# Patient Record
Sex: Male | Born: 1995 | Race: White | Hispanic: No | Marital: Single | Smoking: Never smoker
Health system: Southern US, Community
[De-identification: ages and names within clinical notes are randomized; demographics above are authoritative.]

## PROBLEM LIST (undated history)

## (undated) DIAGNOSIS — G4700 Insomnia, unspecified: Secondary | ICD-10-CM

## (undated) HISTORY — PX: WISDOM TOOTH EXTRACTION: SHX21

---

## 2014-04-11 ENCOUNTER — Emergency Department (HOSPITAL_COMMUNITY)
Admission: EM | Admit: 2014-04-11 | Discharge: 2014-04-11 | Disposition: A | Discharge status: Home / Self Care | Payer: Medicaid Other | Attending: Emergency Medicine | Admitting: Emergency Medicine

## 2014-04-11 ENCOUNTER — Encounter (HOSPITAL_COMMUNITY): Payer: Self-pay | Admitting: Emergency Medicine

## 2014-04-11 DIAGNOSIS — X30XXXA Exposure to excessive natural heat, initial encounter: Secondary | ICD-10-CM | POA: Diagnosis not present

## 2014-04-11 DIAGNOSIS — R110 Nausea: Secondary | ICD-10-CM | POA: Insufficient documentation

## 2014-04-11 DIAGNOSIS — T6701XA Heatstroke and sunstroke, initial encounter: Secondary | ICD-10-CM | POA: Diagnosis not present

## 2014-04-11 DIAGNOSIS — R509 Fever, unspecified: Secondary | ICD-10-CM | POA: Insufficient documentation

## 2014-04-11 DIAGNOSIS — T679XXA Effect of heat and light, unspecified, initial encounter: Secondary | ICD-10-CM

## 2014-04-11 DIAGNOSIS — R000 Tachycardia, unspecified: Secondary | ICD-10-CM | POA: Diagnosis not present

## 2014-04-11 DIAGNOSIS — Y9301 Activity, walking, marching and hiking: Secondary | ICD-10-CM | POA: Insufficient documentation

## 2014-04-11 DIAGNOSIS — R34 Anuria and oliguria: Secondary | ICD-10-CM | POA: Insufficient documentation

## 2014-04-11 DIAGNOSIS — R42 Dizziness and giddiness: Secondary | ICD-10-CM | POA: Insufficient documentation

## 2014-04-11 DIAGNOSIS — Y9289 Other specified places as the place of occurrence of the external cause: Secondary | ICD-10-CM | POA: Diagnosis not present

## 2014-04-11 HISTORY — DX: Insomnia, unspecified: G47.00

## 2014-04-11 LAB — CBC
HCT: 40.5 % (ref 39.0–52.0)
Hemoglobin: 13.9 g/dL (ref 13.0–17.0)
MCH: 30.2 pg (ref 26.0–34.0)
MCHC: 34.3 g/dL (ref 30.0–36.0)
MCV: 87.9 fL (ref 78.0–100.0)
Platelets: 216 10*3/uL (ref 150–400)
RBC: 4.61 MIL/uL (ref 4.22–5.81)
RDW: 12.8 % (ref 11.5–15.5)
WBC: 12.3 10*3/uL — AB (ref 4.0–10.5)

## 2014-04-11 LAB — BASIC METABOLIC PANEL
ANION GAP: 13 (ref 5–15)
BUN: 11 mg/dL (ref 6–23)
CALCIUM: 8.9 mg/dL (ref 8.4–10.5)
CO2: 24 meq/L (ref 19–32)
Chloride: 106 mEq/L (ref 96–112)
Creatinine, Ser: 1.11 mg/dL (ref 0.50–1.35)
GFR calc non Af Amer: >90 mL/min (ref >90)
Glucose, Bld: 96 mg/dL (ref 70–99)
Potassium: 4.1 mEq/L (ref 3.7–5.3)
SODIUM: 143 meq/L (ref 137–147)

## 2014-04-11 MED ORDER — SODIUM CHLORIDE 0.9 % IV BOLUS (SEPSIS)
1000.0000 mL | INTRAVENOUS | Status: AC
  Administered 2014-04-11: 1000 mL via INTRAVENOUS

## 2014-04-11 NOTE — Discharge Instructions (Signed)
1. Medications: usual home medications 2. Treatment: rest, drink plenty of fluids, stay cool today 3. Follow Up: Please followup with your primary doctor for discussion of your diagnoses and further evaluation after today's visit; if you do not have a primary care doctor use the resource guide provided to find one;     Heat-Related Illness Heat-related illnesses occur when the body is unable to properly cool itself. The body normally cools itself by sweating. However, under some conditions sweating is not enough. In these cases, a person's body temperature rises rapidly. Very high body temperatures may damage the brain or other vital organs. Some examples of heat-related illnesses include:  Heat stroke. This occurs when the body is unable to regulate its temperature. The body's temperature rises rapidly, the sweating mechanism fails, and the body is unable to cool down. Body temperature may rise to 106 F (41 C) or higher within 10 to 15 minutes. Heat stroke can cause death or permanent disability if emergency treatment is not provided.  Heat exhaustion. This is a milder form of heat-related illness that can develop after several days of exposure to high temperatures and not enough fluids. It is the body's response to an excessive loss of the water and salt contained in sweat.  Heat cramps. These usually affect people who sweat a lot during heavy activity. This sweating drains the body's salt and moisture. The low salt level in the muscles causes painful cramps. Heat cramps may also be a symptom of heat exhaustion. Heat cramps usually occur in the abdomen, arms, or legs. Get medical attention for cramps if you have heart problems or are on a low-sodium diet. Those that are at greatest risk for heat-related illnesses include:   The elderly.  Infant and the very young.  People with mental illness and chronic diseases.  People who are overweight (obese).  Young and healthy people can even  succumb to heat if they participate in strenuous physical activities during hot weather. CAUSES  Several factors affect the body's ability to cool itself during extremely hot weather. When the humidity is high, sweat will not evaporate as quickly. This prevents the body from releasing heat quickly. Other factors that can affect the body's ability to cool down include:   Age.  Obesity.  Fever.  Dehydration.  Heart disease.  Mental illness.  Poor circulation.  Sunburn.  Prescription drug use.  Alcohol use. SYMPTOMS  Heat stroke: Warning signs of heat stroke vary, but may include:  An extremely high body temperature (above 103F orally).  A fast, strong pulse.  Dizziness.  Confusion.  Red, hot, and dry skin.  No sweating.  Throbbing headache.  Feeling sick to your stomach (nauseous).  Unconsciousness. Heat exhaustion: Warning signs of heat exhaustion include:  Heavy sweating.  Tiredness.  Headache.  Paleness.  Weakness.  Feeling sick to your stomach (nauseous) or vomiting.  Muscle cramps. Heat cramps  Muscle pains or spasms. TREATMENT  Heat stroke  Get into a cool environment. An indoor place that is air-conditioned may be best.  Take a cool shower or bath. Have someone around to make sure you are okay.  Take your temperature. Make sure it is going down. Heat exhaustion  Drink plenty of fluids. Do not drink liquids that contain caffeine, alcohol, or large amounts of sugar. These cause you to lose more body fluid. Also, avoid very cold drinks. They can cause stomach cramps.  Get into a cool environment. An indoor place that is air-conditioned may be best.  Take a cool shower or bath. Have someone around to make sure you are okay.  Put on lightweight clothing. Heat cramps  Stop whatever activity you were doing. Do not attempt to do that activity for at least 3 hours after the cramps have gone away.  Get into a cool environment. An indoor  place that is air-conditioned may be best. HOME CARE INSTRUCTIONS  To protect your health when temperatures are extremely high, follow these tips:  During heavy exercise in a hot environment, drink two to four glasses (16-32 ounces) of cool fluids each hour. Do not wait until you are thirsty to drink. Warning: If your caregiver limits the amount of fluid you drink or has you on water pills, ask how much you should drink while the weather is hot.  Do not drink liquids that contain caffeine, alcohol, or large amounts of sugar. These cause you to lose more body fluid.  Avoid very cold drinks. They can cause stomach cramps.  Wear appropriate clothing. Choose lightweight, light-colored, loose-fitting clothing.  If you must be outdoors, try to limit your outdoor activity to morning and evening hours. Try to rest often in shady areas.  If you are not used to working or exercising in a hot environment, start slowly and pick up the pace gradually.  Stay cool in an air-conditioned place if possible. If your home does not have air conditioning, go to the shopping mall or [employer] library.  Taking a cool shower or bath may help you cool off. SEEK MEDICAL CARE IF:   You see any of the symptoms listed above. You may be dealing with a life-threatening emergency.  Symptoms worsen or last longer than 1 hour.  Heat cramps do not get better in 1 hour. MAKE SURE YOU:   Understand these instructions.  Will watch your condition.  Will get help right away if you are not doing well or get worse. Document Released: 06/19/2008 Document Revised: 12/03/2011 Document Reviewed: 06/19/2008 [facility] Patient Information 2015 [employer] [place]. This information is not intended to replace advice given to you by your health care provider. Make sure you discuss any questions you have with your health care provider.    Emergency Department Resource Guide 1) Find a Doctor and Pay Out of Pocket Although you won't have  to find out who is covered by your insurance plan, it is a good idea to ask around and get recommendations. You will then need to call the office and see if the doctor you have chosen will accept you as a new patient and what types of options they offer for patients who are self-pay. Some doctors offer discounts or will set up payment plans for their patients who do not have insurance, but you will need to ask so you aren't surprised when you get to your appointment.  2) Contact Your Local Health Department Not all health departments have doctors that can see patients for sick visits, but many do, so it is worth a call to see if yours does. If you don't know where your local health department is, you can check in your phone book. The CDC also has a tool to help you locate your state's health department, and many state websites also have listings of all of their local health departments.  3) Find a Walk-in Clinic If your illness is not likely to be very severe or complicated, you may want to try a walk in clinic. These are popping up all over the country in pharmacies,  drugstores, and shopping centers. They're usually staffed by nurse practitioners or physician assistants that have been trained to treat common illnesses and complaints. They're usually fairly quick and inexpensive. However, if you have serious medical issues or chronic medical problems, these are probably not your best option.  No Primary Care Doctor: - Call Health Connect at  [phone] - they can help you locate a primary care doctor that  accepts your insurance, provides certain services, etc. - Physician Referral Service- [phone]  Chronic Pain Problems: Organization         Address  Phone   Notes  [facility]  [phone] Patients need to be referred by their primary care doctor.   Medication Assistance: Organization         Address  Phone   Notes  [facility] [address] [place], [place] [address] [phone] --Must be a resident of [facility] -- Must have NO insurance coverage whatsoever (no Medicaid/ Medicare, etc.) -- The pt. MUST have a primary care doctor that directs their care regularly and follows them in the community   [facility]  [phone]   [employer]  [phone]    Agencies that provide inexpensive medical care: Organization         Address  Phone   Notes  [facility]  [phone]   [facility]    [phone]   [facility] [address] [place], [place] [address] [phone]   [facility] [address], [place] [phone]   Planned Parenthood    [phone]   [facility]    [phone]   [facility]  [address], [place] Phone:  [phone], Fax:  [phone] Hours of Operation:  9 am - 6 pm, M-F.  Also accepts Medicaid/Medicare and self-pay.  [facility]  [address],  Phone: [phone], Fax: [phone]. Hours of Operation:  8:30 am - 5:30 pm, M-F.  Also accepts Medicaid and self-pay.  [facility] [place] [address] [place] Phone: [phone]   [facility] [address] Natasha Bence [place], [place] [phone], Ext. [phone] Mondays & Thursdays: 7-9 AM.  First 15 patients are seen on a first come, first serve basis.    Medicaid-accepting [facility] Providers:  Organization         Address  Phone   Notes  [facility] [address],  [phone] Also accepts self-pay patients.  [facility] [address] Laurell Josephs [place], [place]  [phone]   [facility] [address], [place] [phone]   [facility] [address], [place] [phone]   Renaye Rakers [address], [place]   [phone] Only accepts [place] Access [place] patients after they have their name applied to their card.   Self-Pay (no insurance) in [facility]:  Organization         Address  Phone   Notes  Sickle Cell Patients, [facility] [address] [place], [place] [phone]   [facility] [address] [place], [place] [phone]   [facility] [place]  [address], [place] (  [phone]   [facility]/Dr. Osei-Bonsu  [address], [place] or [address], [place] [phone] Phone number for both [place] and [place] locations is the same.  [facility] [address] [place] [phone]   [facility] [address], [place] or [address] [phone]   [facility] [address] [place] [phone], phone; [phone], fax Sees patients 1st and 3rd Saturday of every month.  Must not qualify for public or private insurance (i.e. Medicaid, Medicare, [place] Health Choice, Veterans' Benefits)  Household income should be no more than 200% of the poverty level The clinic cannot treat you if you are pregnant or think you are pregnant  Sexually transmitted diseases are not treated at the clinic.    Dental Care: Organization         Address  Phone  Notes  [facility] [address] [place], [place] [phone] Accepts children up to age 1 who are enrolled in [place] or [place] Health Choice; pregnant women with a Medicaid card; and children who have applied for Medicaid or [place] Health Choice, but were declined, whose parents can pay a reduced fee at time of service.  [facility]  [address], [place] [phone] Accepts children up to age  66 who are enrolled in [place] or [place] Health Choice; pregnant women with a Medicaid card; and children who have applied for Medicaid or [place] Health Choice, but were declined, whose parents can pay a reduced fee at time of service.  [facility]  [address] [place], [place] [phone] Patients are seen by appointment only. Walk-ins are not accepted. [facility] will see patients 48 years of age and older. Monday - Tuesday (8am-5pm) Most Wednesdays (8:30-5pm) $30 per visit, cash only  [facility] Adult Dental Access PROGRAM  [address], [facility] [phone] Patients are seen by appointment only. Walk-ins are not accepted. [facility] will see patients 55 years of age and older. One Wednesday Evening (Monthly: Volunteer Based).  $30 per visit, cash only  [employer]  [phone] for adults; Children under age 47, call Graduate Pediatric Dentistry at [phone]. Children aged 47-14, please call [phone] to request a pediatric application.  Dental services are provided in all areas of dental care including fillings, crowns and bridges, complete and partial dentures, implants, gum treatment, root canals, and extractions. Preventive care is also provided. Treatment is provided to both adults and children. Patients are selected via a lottery and there is often a waiting list.   [facility] [address], [place]  [phone] [email or web address]   [facility] [address] [place], [place] [phone], Ext. [phone] Second and Fourth Thursday of each month, opens at 6:30 AM; Clinic ends at 9 AM.  Patients are seen on a first-come first-served basis, and a limited number are seen during each clinic.   [facility]  [address] Ether Griffins [place], [place] [phone]   Eligibility Requirements You must have lived in [place], [place], or [place] counties for at least the last three  months.   You cannot be eligible for state or federal sponsored [place], including [employer], [place], or [employer].   You generally cannot be  eligible for healthcare insurance through your employer.    How to apply: Eligibility screenings are held every Tuesday and Wednesday afternoon from 1:00 pm until 4:00 pm. You do not need an appointment for the interview!  [facility] [address], [place], [place] [phone]   [facility] Health Department  [phone]   [facility] Health Department  [phone]   [facility] Health Department  [phone]    Behavioral Health Resources in the Community: Intensive Outpatient Programs Organization         Address  Phone  Notes  [facility] Services [address], [place], [place] [phone]   [facility] Outpatient [address] [place], [place] [phone]   [facility] [address], [place], [place]  [phone]   [facility] [address],  [place], [place] [phone] or [phone]   Substance Abuse Resources Organization         Address  Phone  Notes  [facility]  [phone]   [facility]  [phone]   [facility]  [phone]   Floydene Flock  [phone]   Residential & Outpatient Substance Abuse Program  [phone]   Psychological Services Organization         Address  Phone  Notes  [facility] Behavioral Health  [phone]   [facility]  [phone]   [facility] Mental Health [address], [place] [phone] or [phone]    Mobile Crisis Teams Organization         Address  Phone  Notes  [facility], Mobile Crisis Care Unit  [phone]   [facility]  [address] [place], [place] [phone]   Doristine Locks [address] [place] [phone]    Self-Help/Support  Groups Organization         Address  Phone             Notes  [facility] [place] - variety of support groups  336- I7437963 Call for more information  Narcotics Anonymous (NA), [facility] [address], [employer] [place]  2 meetings at this location   Statistician         Address  Phone  Notes  [facility] [address],    [place]  [phone]   [facility]  [address] [place] [address], [place], [place] [phone]   [facility] [address] [place], [place] [phone] Admissions: 8am-3pm M-F  [facility] [address],    [place], [place] [phone]   [facility] [address] [place], [place], [place] [phone]   [facility] [address],  [place], [place] [phone]   [facility] - Intensive Outpatient [address] Laurell Josephs [address], [place], [place] [phone]   [facility] ([facility] [address] [place]  [place], [place] [phone] or [phone]   [facility] ([facility]) [address] [place], [place] [phone] Accepts Medicaid  [facility] [address],  [place] [phone] Substance Abuse/Addiction Treatment   [facility] Organization         Address  Phone  Notes  [facility]  [phone]   Angie Fava, PhD [address] [place], [place]   [phone] or [phone]   [facility]   [address] [place],  [place] [phone]   [facility] [address] [place], [place] [phone] Insurance/Medicaid/sponsorship through [employer] and Families [address]                                    [place], [place] [phone] Therapy/tele-psych/case  [facility] [address]   [place], [place] [phone]    Dr. Lolly Mustache  [phone]   [facility] [place]  [employer] [facility] Dept. 1) [address] [place] 2) [address], [place] 3)  [address], [place] [phone]  [phone]   [facility] Child Abuse Hotline 218-780-5745 or [phone] (After Hours)

## 2014-04-11 NOTE — ED Provider Notes (Signed)
Medical screening examination/treatment/procedure(s) were performed by non-physician practitioner and as supervising physician I was immediately available for consultation/collaboration.   EKG Interpretation None        Gilda Creasehristopher J. Cason Dabney, MD 04/11/14 2312

## 2014-04-11 NOTE — ED Provider Notes (Signed)
CSN: [id number]     Arrival date & time 04/11/14  1544 History   First MD Initiated Contact with Patient 04/11/14 1548     Chief Complaint  Patient presents with  . Heat Exposure     (Consider location/radiation/quality/duration/timing/severity/associated sxs/prior Treatment) The history is provided by the patient and medical records. No language interpreter was used.    Omar Murray is a 18 y.o. male  with no medical problems presents to the Emergency Department complaining of gradual, persistent, progressively worsening heat exhaustion onset 3pm. Pt reports he has been walking since 9am from his home to meet a friend.  Pt reports he drank 2.5 bottles of water today.  He reports he walked to far and became exhausted.  Pt reports he stopped sweating sometime this afternoon.  He reports he flagged down the police officer because he was lost and was feeling dizzy, lightheaded, nausea and "overheated."  Pt reports that resting made the symptoms better, but they returned when he began to walk again.  Pt reports he has not urinated since 9am.  Pt denies fever, chills, headache, neck pain, chest pain, SOB, abd pain, vomiting, diarrhea, weakness, syncope, muscle cramps.       Past Medical History  Diagnosis Date  . Insomnia    History reviewed. No pertinent past surgical history. No family history on file. History  Substance Use Topics  . Smoking status: Never Smoker   . Smokeless tobacco: Not on file  . Alcohol Use: No    Review of Systems  Constitutional: Negative for fever, diaphoresis, appetite change, fatigue and unexpected weight change.  HENT: Negative for mouth sores.   Eyes: Negative for visual disturbance.  Respiratory: Negative for cough, chest tightness, shortness of breath and wheezing.   Cardiovascular: Negative for chest pain.  Gastrointestinal: Positive for nausea. Negative for vomiting, abdominal pain, diarrhea and constipation.  Endocrine: Negative for polydipsia,  polyphagia and polyuria.  Genitourinary: Positive for decreased urine volume. Negative for dysuria, urgency, frequency and hematuria.  Musculoskeletal: Negative for back pain and neck stiffness.  Skin: Negative for rash.  Allergic/Immunologic: Negative for immunocompromised state.  Neurological: Positive for dizziness and light-headedness. Negative for syncope and headaches.  Hematological: Does not bruise/bleed easily.  Psychiatric/Behavioral: Negative for sleep disturbance. The patient is not nervous/anxious.       Allergies  Celexa and Hydrocodone  Home Medications   Prior to Admission medications   Not on File   BP 121/44  Pulse 80  Temp(Src) 98.1 F (36.7 C) (Oral)  Resp 18  SpO2 99% Physical Exam  Nursing note and vitals reviewed. Constitutional: He appears well-developed and well-nourished. No distress.  Awake, alert, nontoxic appearance  HENT:  Head: Normocephalic and atraumatic.  Mouth/Throat: Oropharynx is clear and moist. No oropharyngeal exudate.  Eyes: Conjunctivae are normal. No scleral icterus.  Neck: Normal range of motion. Neck supple.  Cardiovascular: Regular rhythm, normal heart sounds and intact distal pulses.   Tachycardia  Pulmonary/Chest: Effort normal and breath sounds normal. No respiratory distress. He has no wheezes.  Abdominal: Soft. Bowel sounds are normal. He exhibits no distension and no mass. There is no tenderness. There is no rebound and no guarding.  Musculoskeletal: Normal range of motion. He exhibits no edema.  Neurological: He is alert.  Speech is clear and goal oriented Moves extremities without ataxia  Skin: Skin is warm and dry. He is not diaphoretic.  Skin hot to touch, dry  Psychiatric: He has a normal mood and affect.  ED Course  Procedures (including critical care time) Labs Review Labs Reviewed  CBC - Abnormal; Notable for the following:    WBC 12.3 (*)    All other components within normal limits  BASIC METABOLIC  PANEL    Imaging Review No results found.   EKG Interpretation None      MDM   Final diagnoses:  Heat exposure, initial encounter  Tachycardia   Omar Murray presents with complaint of heat exhaustion after walking all day.  Tachycardic on exam, skin hot and dry. No rash. Patient alert and oriented x3. We'll give fluid bolus, check electrolytes and reassess.  5:05 PM  Rectal temperature 100.36F.  6:49 PM Pt given NS 2L with resolution of tachycardia and fever. Patient has urinated several times while here in the emergency department reports he feels as if he is returned to normal. Reevaluation shows the patient has not developed a rash.  CBC with mild leukocytosis but no electrolyte disturbance.  I have personally reviewed patient's vitals, nursing note and any pertinent labs or imaging.  I performed an undressed physical exam.    At this time, it has been determined that no acute conditions requiring further emergency intervention. The patient/guardian have been advised of the diagnosis and plan. I reviewed all labs and imaging including any potential incidental findings. We have discussed signs and symptoms that warrant return to the ED, such as increasing fever, intractable vomiting or other concerning factors.  Patient/guardian has voiced understanding and agreed to follow-up with the PCP or specialist in 3 days.  Vital signs are stable at discharge.   BP 121/44  Pulse 80  Temp(Src) 98.1 F (36.7 C) (Oral)  Resp 18  SpO2 99%        Dierdre ForthHannah Jamylah Marinaccio, PA-C 04/11/14 2033
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
## 2014-04-11 NOTE — ED Notes (Signed)
Bed: AV40WA16 Expected date:  Expected time:  Means of arrival:  Comments: EMS Heat s/s

## 2014-05-04 ENCOUNTER — Encounter (HOSPITAL_COMMUNITY): Payer: Self-pay | Admitting: Emergency Medicine

## 2014-05-04 ENCOUNTER — Emergency Department (INDEPENDENT_AMBULATORY_CARE_PROVIDER_SITE_OTHER)
Admission: EM | Admit: 2014-05-04 | Discharge: 2014-05-04 | Disposition: A | Discharge status: Home / Self Care | Payer: Medicaid Other | Source: Home / Self Care | Attending: Family Medicine | Admitting: Family Medicine

## 2014-05-04 DIAGNOSIS — J029 Acute pharyngitis, unspecified: Secondary | ICD-10-CM

## 2014-05-04 LAB — POCT RAPID STREP A: STREPTOCOCCUS, GROUP A SCREEN (DIRECT): NEGATIVE

## 2014-05-04 LAB — POCT INFECTIOUS MONO SCREEN: Mono Screen: NEGATIVE

## 2014-05-04 MED ORDER — CLINDAMYCIN HCL 300 MG PO CAPS: 300.0000 mg | ORAL_CAPSULE | Freq: Three times a day (TID) | ORAL | Status: DC

## 2014-05-04 MED ORDER — METHYLPREDNISOLONE 4 MG PO KIT: PACK | ORAL | Status: DC

## 2014-05-04 NOTE — ED Notes (Signed)
C/o ore throat since last night  States he did use salt water gargle  Admits to having low energy

## 2014-05-04 NOTE — Discharge Instructions (Signed)

## 2014-05-04 NOTE — ED Provider Notes (Signed)
CSN: [id number]     Arrival date & time 05/04/14  1530 History   First MD Initiated Contact with Patient 05/04/14 1600     Chief Complaint  Patient presents with  . Sore Throat   (Consider location/radiation/quality/duration/timing/severity/associated sxs/prior Treatment) HPI Comments: 18 year old male presents for evaluation of sore throat. This started yesterday. He has a constant sore throat that is worse with swallowing. He also had general body aches and subjective fever. Over-the-counter medications are not helping significantly. Pain is mild, but is 7/10 in severity when he swallows. No recent travel or sick contacts.  Patient is a 18 y.o. male presenting with pharyngitis.  Sore Throat    Past Medical History  Diagnosis Date  . Insomnia    History reviewed. No pertinent past surgical history. History reviewed. No pertinent family history. History  Substance Use Topics  . Smoking status: Never Smoker   . Smokeless tobacco: Not on file  . Alcohol Use: No    Review of Systems  Constitutional: Positive for fever, chills and fatigue.  HENT: Positive for sore throat.   Musculoskeletal: Positive for arthralgias and myalgias.  All other systems reviewed and are negative.   Allergies  Celexa and Hydrocodone  Home Medications   Prior to Admission medications   Medication Sig Start Date End Date Taking? Authorizing Provider  clindamycin (CLEOCIN) 300 MG capsule Take 1 capsule (300 mg total) by mouth 3 (three) times daily. 05/04/14   Graylon GoodZachary H Nayla Dias, PA-C  methylPREDNISolone (MEDROL DOSEPAK) 4 MG tablet Use as directed on package instructions 05/04/14   Graylon GoodZachary H Hisako Bugh, PA-C   BP 114/62  Pulse 106  Temp(Src) 99.7 F (37.6 C) (Oral)  Resp 14  SpO2 100% Physical Exam  Nursing note and vitals reviewed. Constitutional: He is oriented to person, place, and time. He appears well-developed and well-nourished. No distress.  HENT:  Head: Normocephalic and atraumatic.  Right  Ear: External ear normal.  Left Ear: External ear normal.  Nose: Nose normal.  Mouth/Throat: Uvula is midline. Oropharyngeal exudate and posterior oropharyngeal erythema present.  Purulent drainage from behind the left tonsil  Eyes: Conjunctivae are normal. Right eye exhibits no discharge. Left eye exhibits no discharge.  Neck: Normal range of motion. Neck supple.  Cardiovascular: Normal rate, regular rhythm and normal heart sounds.   Pulmonary/Chest: Effort normal and breath sounds normal. No respiratory distress.  Lymphadenopathy:    He has cervical adenopathy (tonsillar and superficial cervical).  Neurological: He is alert and oriented to person, place, and time. Coordination normal.  Skin: Skin is warm and dry. No rash noted. He is not diaphoretic.  Psychiatric: He has a normal mood and affect. Judgment normal.    ED Course  Procedures (including critical care time) Labs Review Labs Reviewed  POCT RAPID STREP A ([facility] CARE ONLY)  POCT INFECTIOUS MONO SCREEN    Imaging Review No results found.   MDM   1. Pharyngitis    Rapid strep and mono negative. Treat with clindamycin. Followup as needed   Meds ordered this encounter  Medications  . clindamycin (CLEOCIN) 300 MG capsule    Sig: Take 1 capsule (300 mg total) by mouth 3 (three) times daily.    Dispense:  21 capsule    Refill:  0    Order Specific Question:  Supervising Provider    Answer:  Clementeen GrahamOREY, EVAN, S [id number]]  . methylPREDNISolone (MEDROL DOSEPAK) 4 MG tablet    Sig: Use as directed on package instructions    Dispense:  21 tablet    Refill:  0    Order Specific Question:  Supervising Provider    Answer:  Clementeen Graham, Kathie Rhodes [[id number]]       Graylon Good, PA-C 05/04/14 [phone]

## 2014-05-05 NOTE — ED Provider Notes (Signed)
Medical screening examination/treatment/procedure(s) were performed by resident physician or non-physician practitioner and as supervising physician I was immediately available for consultation/collaboration.   KINDL,JAMES DOUGLAS MD.   James D Kindl, MD 05/05/14 2031 

## 2014-05-06 LAB — CULTURE, GROUP A STREP

## 2014-05-11 ENCOUNTER — Emergency Department (HOSPITAL_COMMUNITY)
Admission: EM | Admit: 2014-05-11 | Discharge: 2014-05-11 | Disposition: A | Discharge status: Home / Self Care | Payer: Medicaid Other | Attending: Emergency Medicine | Admitting: Emergency Medicine

## 2014-05-11 ENCOUNTER — Emergency Department (HOSPITAL_COMMUNITY): Payer: Medicaid Other

## 2014-05-11 ENCOUNTER — Encounter (HOSPITAL_COMMUNITY): Payer: Self-pay | Admitting: Emergency Medicine

## 2014-05-11 DIAGNOSIS — M65839 Other synovitis and tenosynovitis, unspecified forearm: Secondary | ICD-10-CM | POA: Diagnosis not present

## 2014-05-11 DIAGNOSIS — R0789 Other chest pain: Secondary | ICD-10-CM | POA: Diagnosis not present

## 2014-05-11 DIAGNOSIS — J069 Acute upper respiratory infection, unspecified: Secondary | ICD-10-CM | POA: Diagnosis not present

## 2014-05-11 DIAGNOSIS — R05 Cough: Secondary | ICD-10-CM | POA: Diagnosis present

## 2014-05-11 DIAGNOSIS — R5383 Other fatigue: Secondary | ICD-10-CM

## 2014-05-11 DIAGNOSIS — R42 Dizziness and giddiness: Secondary | ICD-10-CM | POA: Insufficient documentation

## 2014-05-11 DIAGNOSIS — M65849 Other synovitis and tenosynovitis, unspecified hand: Secondary | ICD-10-CM

## 2014-05-11 DIAGNOSIS — R5381 Other malaise: Secondary | ICD-10-CM | POA: Diagnosis not present

## 2014-05-11 DIAGNOSIS — Z792 Long term (current) use of antibiotics: Secondary | ICD-10-CM | POA: Insufficient documentation

## 2014-05-11 DIAGNOSIS — R059 Cough, unspecified: Secondary | ICD-10-CM | POA: Diagnosis present

## 2014-05-11 DIAGNOSIS — M659 Synovitis and tenosynovitis, unspecified: Secondary | ICD-10-CM

## 2014-05-11 MED ORDER — IBUPROFEN 200 MG PO TABS
600.0000 mg | ORAL_TABLET | Freq: Once | ORAL | Status: AC
  Administered 2014-05-11: 600 mg via ORAL
  Filled 2014-05-11: qty 3

## 2014-05-11 NOTE — Discharge Instructions (Signed)
We saw you in the ER for the wrist pain and the cough, chest discomfort. All the results in the ER are normal. We are not sure what is causing your symptoms. Keep the wrist splint on, use RICE treatment (read below), and if not better in a week or 2, see the school doctors. Finish the antibiotics and steroids given to you. Motrin for pain, 600 mg every 6 hours with food.   Extensor Pollicis Longus Tendinitis Extensor pollicis longus (EPL) tendonitis is a condition in which the EPL tendon lining (sheath) becomes inflamed. This causes pain on the thumb side (radial side) of the back of the wrist. The EPL tendon attaches the EPL muscle to the bone. The EPL muscle straightens the thumb. The tendon lining secretes a lubricant that allows the EPL to glide smoothly. When the lining becomes inflamed, the tendon can not glide smoothly, which causes pain. There are three grades of EPL tendonitis. A grade 1 strain is a mild strain, where the tendon experiences a slight pull, but no obvious tearing (microscopic tearing). There is no loss of strength, and the tendon is the correct length. Grade 2 strains are a moderate strain. There is tearing of tendon fibers within the tendon or where the tendon meets the bone or muscle. Tearing of the tendon causes the length of the whole muscle-tendon-bone unit to increase. A grade 2 injury usually results in decreased strength. A grade 3 strain is a complete rupture of the tendon.  CAUSES   EPL tendinitis is often an overuse injury and caused by repeated motions of the hand and wrist, due to friction of the tendon within the lining.  Sudden increase in activity or change in activity. SYMPTOMS   Vague, diffuse pain and tenderness over the thumb side of the back of the wrist.  Pain that gets worse when straightening the thumb.  Locking, catching, or triggering of the thumb joint.  Limited motion of the thumb.  Little or no swelling, warmth, or redness of the back of the  wrist.  Crackling sound (crepitation) when the tendon or wrist is moved or touched. RISK INCREASES WITH:  Sports that involve repeated hand and wrist motions (tennis, golf, bowling).  Previous wrist injury.  Heavy labor.  Poor wrist strength and flexibility.  Failure to warm up properly before activity. PREVENTION   Warm up and stretch properly before activity.  Allow time for adequate rest and recovery between practices and competition.  Maintain physical fitness:  Forearm, wrist, and hand flexibility.  Muscle strength and endurance.  Learn and use proper exercise technique. PROGNOSIS  This condition is often curable within 6 weeks, if treated properly with non-surgical treatment and resting the affected area. Surgery may be advised. RELATED COMPLICATIONS   Longer healing time, if not properly treated, or if not given adequate time to heal.  Chronic inflammation of the EPL tendon, causing pain.  Recurring of symptoms, especially if activity is resumed too soon.  Risks of surgery: infection, bleeding, injury to nerves (numbness of the thumb), continued pain, incomplete release of the tendon lining, recurring symptoms, cutting of the tendon, thumb weakness, thumb stiffness, and inability to straighten the thumb. TREATMENT  Treatment first involves ice and medicine to reduce pain and inflammation. Stretching and strengthening exercises, along with activity modification, may be advised to reduce painful symptoms. For certain cases, a brace, splint, or taping may be advised, to reduce wrist movement during activity. Your caregiver may recommend a corticosteroid injection to reduce inflammation. If  non-surgical treatment is unsuccessful, surgery may be advised to release the inflamed tendon. If the tendon is torn, it may require repair or replacement (reconstruction). MEDICATION   If pain medicine is needed, nonsteroidal anti-inflammatory medicines, (aspirin and ibuprofen), or  other minor pain relievers (acetaminophen), are often advised.  Do not take pain medicine for 7 days before surgery.  Prescription pain relievers are usually only prescribed after surgery. Use only as directed and only as much as you need.  Cortisone injections reduce inflammation. However, these are used only in extreme cases, because there is a limit to the number of times they may be given. These injections may weaken muscle and tendon tissue. Anesthetics also temporarily relieve pain. COLD THERAPY  Cold treatment (icing) relieves pain and reduces inflammation. Cold treatment should be applied for 10 to 15 minutes every 2 to 3 hours, and immediately after activity that aggravates your symptoms. Use ice packs or an ice massage. SEEK MEDICAL CARE IF:   Symptoms get worse or do not improve in 2 to 4 weeks, despite treatment.  You experience pain, numbness, or coldness in the hand.  Blue, gray, or dark color appears in the fingernails.  Any of the following occur after surgery: increased pain, swelling, redness, drainage of fluids, bleeding in the affected area, or signs of infection.  New, unexplained symptoms develop. (Drugs used in treatment may produce side effects.) Document Released: 09/10/2005 Document Revised: 12/03/2011 Document Reviewed: 12/23/2008 [facility] Patient Information 2015 [place], [place]. This information is not intended to replace advice given to you by your health care provider. Make sure you discuss any questions you have with your health care provider.   RICE: Routine Care for Injuries The routine care of many injuries includes Rest, Ice, Compression, and Elevation (RICE). HOME CARE INSTRUCTIONS  Rest is needed to allow your body to heal. Routine activities can usually be resumed when comfortable. Injured tendons and bones can take up to 6 weeks to heal. Tendons are the cord-like structures that attach muscle to bone.  Ice following an injury helps keep the  swelling down and reduces pain.  Put ice in a plastic bag.  Place a towel between your skin and the bag.  Leave the ice on for 15-20 minutes, 3-4 times a day, or as directed by your health care provider. Do this while awake, for the first 24 to 48 hours. After that, continue as directed by your caregiver.  Compression helps keep swelling down. It also gives support and helps with discomfort. If an elastic bandage has been applied, it should be removed and reapplied every 3 to 4 hours. It should not be applied tightly, but firmly enough to keep swelling down. Watch fingers or toes for swelling, bluish discoloration, coldness, numbness, or excessive pain. If any of these problems occur, remove the bandage and reapply loosely. Contact your caregiver if these problems continue.  Elevation helps reduce swelling and decreases pain. With extremities, such as the arms, hands, legs, and feet, the injured area should be placed near or above the level of the heart, if possible. SEEK IMMEDIATE MEDICAL CARE IF:  You have persistent pain and swelling.  You develop redness, numbness, or unexpected weakness.  Your symptoms are getting worse rather than improving after several days. These symptoms may indicate that further evaluation or further X-rays are needed. Sometimes, X-rays may not show a small broken bone (fracture) until 1 week or 10 days later. Make a follow-up appointment with your caregiver. Ask when your X-ray results  will be ready. Make sure you get your X-ray results. Document Released: 12/23/2000 Document Revised: 09/15/2013 Document Reviewed: 02/09/2011 [facility] Patient Information 2015 [place]. This information is not intended to replace advice given to you by your health care provider. Make sure you discuss any questions you have with your health care provider.

## 2014-05-11 NOTE — ED Notes (Signed)
Patient transported to X-ray 

## 2014-05-11 NOTE — ED Notes (Signed)
Pt reports L thumb pain that radiates up to his wrist.  Pt also reports cough, recently dx with pharyngitis, not getting better.

## 2014-05-11 NOTE — ED Provider Notes (Signed)
CSN: [id number]     Arrival date & time 05/11/14  0105 History   First MD Initiated Contact with Patient 05/11/14 0119     Chief Complaint  Patient presents with  . Cough  . Hand Pain     (Consider location/radiation/quality/duration/timing/severity/associated sxs/prior Treatment) HPI Comments: Pt comes in with cc of hand pain and couch. On 13th, patient was diagnosed pharyngitis - he got antibiotics and steroids, he took 1 day of the course, felt better, and stopped it. Pt however developed a cough, with chest pain and throat soreness, 4 days ago, getting worse. + fevers at night. Symptoms worse in the AM.  Pt also has left thumb pain. Pt admits to playing video games, typing and playing instruments constantly. The thumb pain started 3 days ago. Constant, throbbing pain, worse with movement. No trauma.  Patient is a 18 y.o. male presenting with cough and hand pain. The history is provided by the patient.  Cough Associated symptoms: chills, fever and myalgias   Associated symptoms: no chest pain, no headaches, no rash and no shortness of breath   Hand Pain Pertinent negatives include no chest pain, no headaches and no shortness of breath.    Past Medical History  Diagnosis Date  . Insomnia    Past Surgical History  Procedure Laterality Date  . Wisdom tooth extraction     No family history on file. History  Substance Use Topics  . Smoking status: Never Smoker   . Smokeless tobacco: Not on file  . Alcohol Use: No    Review of Systems  Constitutional: Positive for fever, chills and activity change.  Eyes: Negative for visual disturbance.  Respiratory: Positive for cough and chest tightness. Negative for shortness of breath.   Cardiovascular: Negative for chest pain.  Gastrointestinal: Negative for abdominal distention.  Genitourinary: Negative for dysuria, enuresis and difficulty urinating.  Musculoskeletal: Positive for arthralgias and myalgias. Negative for neck pain.   Skin: Negative for rash.  Allergic/Immunologic: Negative for immunocompromised state.  Neurological: Positive for dizziness and weakness. Negative for light-headedness and headaches.  Psychiatric/Behavioral: Negative for confusion.      Allergies  Celexa and Hydrocodone  Home Medications   Prior to Admission medications   Medication Sig Start Date End Date Taking? Authorizing Provider  acetaminophen (TYLENOL) 500 MG tablet Take 500 mg by mouth every 6 (six) hours as needed for moderate pain.   Yes Historical Provider, MD  ibuprofen (ADVIL,MOTRIN) 200 MG tablet Take 400 mg by mouth every 6 (six) hours as needed for moderate pain.   Yes Historical Provider, MD  clindamycin (CLEOCIN) 300 MG capsule Take 1 capsule (300 mg total) by mouth 3 (three) times daily. 05/04/14   Graylon GoodZachary H Baker, PA-C  methylPREDNISolone (MEDROL DOSEPAK) 4 MG tablet Use as directed on package instructions 05/04/14   Graylon GoodZachary H Baker, PA-C   BP 126/71  Pulse 104  Temp(Src) 99.3 F (37.4 C) (Oral)  Resp 16  SpO2 98% Physical Exam  Nursing note and vitals reviewed. Constitutional: He is oriented to person, place, and time. He appears well-developed.  HENT:  Head: Normocephalic and atraumatic.  Mouth/Throat: Oropharyngeal exudate present.  Eyes: Conjunctivae and EOM are normal. Pupils are equal, round, and reactive to light.  Neck: Normal range of motion. Neck supple.  Cardiovascular: Normal rate and regular rhythm.   Pulmonary/Chest: Effort normal and breath sounds normal. He has no wheezes.  Abdominal: Soft. Bowel sounds are normal. He exhibits no distension. There is no tenderness. There is no  rebound and no guarding.  Lymphadenopathy:    He has cervical adenopathy.  Neurological: He is alert and oriented to person, place, and time.  Skin: Skin is warm.    ED Course  Procedures (including critical care time) Labs Review Labs Reviewed - No data to display  Imaging Review Dg Chest 2 View  05/11/2014    CLINICAL DATA:  Chest pain  EXAM: CHEST  2 VIEW  COMPARISON:  None.  FINDINGS: Normal heart size and mediastinal contours. No acute infiltrate or edema. No effusion or pneumothorax. No acute osseous findings.  IMPRESSION: No active cardiopulmonary disease.   Electronically Signed   By: Tiburcio Pea M.D.   On: 05/11/2014 03:15     EKG Interpretation None      MDM   Final diagnoses:  Tenosynovitis, wrist  URI, acute   Pt comes in with cc of wrist pain and cough, sore throat. He needs to finish his antibiotic course (clinda), and he has been advised to do so. No need to swab him again either. Pt has thump pain - suspecting tendinitis vs. Tenosynovitis. Splint given. RICE tx given.   Derwood Kaplan, MD 05/11/14 0430

## 2016-12-28 ENCOUNTER — Ambulatory Visit (HOSPITAL_COMMUNITY)
Admission: EM | Admit: 2016-12-28 | Discharge: 2016-12-28 | Disposition: A | Discharge status: Home / Self Care | Payer: BLUE CROSS/BLUE SHIELD | Attending: Internal Medicine | Admitting: Internal Medicine

## 2016-12-28 ENCOUNTER — Encounter (HOSPITAL_COMMUNITY): Payer: Self-pay | Admitting: Emergency Medicine

## 2016-12-28 DIAGNOSIS — R300 Dysuria: Secondary | ICD-10-CM | POA: Diagnosis not present

## 2016-12-28 LAB — POCT URINALYSIS DIP (DEVICE)
BILIRUBIN URINE: NEGATIVE
Glucose, UA: NEGATIVE mg/dL
Hgb urine dipstick: NEGATIVE
Ketones, ur: NEGATIVE mg/dL
Leukocytes, UA: NEGATIVE
NITRITE: NEGATIVE
PROTEIN: NEGATIVE mg/dL
SPECIFIC GRAVITY, URINE: 1.02 (ref 1.005–1.030)
UROBILINOGEN UA: 0.2 mg/dL (ref 0.0–1.0)
pH: 7 (ref 5.0–8.0)

## 2016-12-28 NOTE — ED Provider Notes (Signed)
CSN: [id number]     Arrival date & time 12/28/16  1645 History   First MD Initiated Contact with Patient 12/28/16 1752     Chief Complaint  Patient presents with  . Urinary Tract Infection   (Consider location/radiation/quality/duration/timing/severity/associated sxs/prior Treatment) Subjective:  Omar Murray is a 21 y.o. male who complains of burning with urination, urinary frequency, suprapubic pressure and urinary urgency for 4 months. The patient also complains of right testicle and right groin pain. The patient denies back pain, fever, flank pain, penile discharge, penile pain, penile swelling or scrotal pain. He is sexually active and reports using safe sex practices always. He does not have a history of recurrent UTI.  Patient does not have a history of pyelonephritis. Notably, the patient was evaluated for the same a couple months ago. Patient reports that his urine was checked as well as a PSA and prostate exam which were all negative for anything acute. The symptoms have been persistent over the past several months. He was given a trial of antibiotics previously without any relief in his symptoms.  The following portions of the patient's history were reviewed and updated as appropriate: allergies, current medications, past family history, past medical history, past social history, past surgical history and problem list.        Past Medical History:  Diagnosis Date  . Insomnia    Past Surgical History:  Procedure Laterality Date  . WISDOM TOOTH EXTRACTION     History reviewed. No pertinent family history. Social History  Substance Use Topics  . Smoking status: Never Smoker  . Smokeless tobacco: Never Used  . Alcohol use No    Review of Systems  Genitourinary: Positive for dysuria, frequency, testicular pain and urgency. Negative for discharge, flank pain, hematuria, penile pain, penile swelling and scrotal swelling.    Allergies  Celexa [citalopram hydrobromide] and  Hydrocodone  Home Medications   Prior to Admission medications   Medication Sig Start Date End Date Taking? Authorizing Provider  divalproex (DEPAKOTE) 500 MG DR tablet Take 500 mg by mouth 3 (three) times daily.   Yes Historical Provider, MD  lamoTRIgine (LAMICTAL) 100 MG tablet Take 100 mg by mouth daily.   Yes Historical Provider, MD  acetaminophen (TYLENOL) 500 MG tablet Take 500 mg by mouth every 6 (six) hours as needed for moderate pain.    Historical Provider, MD  clindamycin (CLEOCIN) 300 MG capsule Take 1 capsule (300 mg total) by mouth 3 (three) times daily. 05/04/14   Graylon Good, PA-C  ibuprofen (ADVIL,MOTRIN) 200 MG tablet Take 400 mg by mouth every 6 (six) hours as needed for moderate pain.    Historical Provider, MD  methylPREDNISolone (MEDROL DOSEPAK) 4 MG tablet Use as directed on package instructions 05/04/14   Graylon Good, PA-C   Meds Ordered and Administered this Visit  Medications - No data to display  BP 131/60 (BP Location: Left Arm)   Pulse 85   Temp 99.1 F (37.3 C) (Oral)   Resp 16   SpO2 100%  No data found.   Physical Exam  Constitutional: He is oriented to person, place, and time. He appears well-developed and well-nourished. No distress.  Cardiovascular: Normal rate, regular rhythm and normal heart sounds.   Pulmonary/Chest: Effort normal and breath sounds normal.  Abdominal: Soft. Bowel sounds are normal. He exhibits no distension. There is no tenderness. No hernia.  Genitourinary: Penis normal. No penile tenderness.  Genitourinary Comments: Chaperone present. Normal testicular exam.   Neurological: He  is alert and oriented to person, place, and time.  Skin: Skin is warm and dry.  Psychiatric: He has a normal mood and affect.      Urgent Care Course     Procedures (including critical care time)  Labs Review Labs Reviewed  POCT URINALYSIS DIP (DEVICE)    Imaging Review No results found.   Visual Acuity Review  Right Eye  Distance:   Left Eye Distance:   Bilateral Distance:    Right Eye Near:   Left Eye Near:    Bilateral Near:         MDM   1. Dysuria    UA negative for acute infection. Exam benign. Patient referred to urology for further evaluation. Instructed to call to schedule appointment.   Discussed diagnosis and treatment with patient. All questions have been answered and all concerns have been addressed. The patient verbalized understanding and had no further questions     Lurline Idol, FNP 12/28/16 1819

## 2016-12-28 NOTE — ED Triage Notes (Signed)
Pt c/o UTI sx onset yest associated w/dysuria, abd pain  Denies fevers, penile d/c, hematuria  A&O x4... NAD

## 2018-03-26 ENCOUNTER — Emergency Department (HOSPITAL_COMMUNITY): Payer: BLUE CROSS/BLUE SHIELD

## 2018-03-26 ENCOUNTER — Emergency Department (HOSPITAL_COMMUNITY)
Admission: EM | Admit: 2018-03-26 | Discharge: 2018-03-26 | Disposition: A | Discharge status: Home / Self Care | Payer: BLUE CROSS/BLUE SHIELD | Attending: Emergency Medicine | Admitting: Emergency Medicine

## 2018-03-26 ENCOUNTER — Other Ambulatory Visit: Payer: Self-pay

## 2018-03-26 DIAGNOSIS — R0789 Other chest pain: Secondary | ICD-10-CM | POA: Insufficient documentation

## 2018-03-26 DIAGNOSIS — R1013 Epigastric pain: Secondary | ICD-10-CM | POA: Diagnosis not present

## 2018-03-26 DIAGNOSIS — M25512 Pain in left shoulder: Secondary | ICD-10-CM | POA: Diagnosis not present

## 2018-03-26 DIAGNOSIS — Z79899 Other long term (current) drug therapy: Secondary | ICD-10-CM | POA: Insufficient documentation

## 2018-03-26 DIAGNOSIS — M25519 Pain in unspecified shoulder: Secondary | ICD-10-CM

## 2018-03-26 DIAGNOSIS — R109 Unspecified abdominal pain: Secondary | ICD-10-CM

## 2018-03-26 LAB — URINALYSIS, ROUTINE W REFLEX MICROSCOPIC
BILIRUBIN URINE: NEGATIVE
GLUCOSE, UA: NEGATIVE mg/dL
HGB URINE DIPSTICK: NEGATIVE
Ketones, ur: 5 mg/dL — AB
Leukocytes, UA: NEGATIVE
Nitrite: NEGATIVE
Protein, ur: NEGATIVE mg/dL
Specific Gravity, Urine: 1.027 (ref 1.005–1.030)
pH: 7 (ref 5.0–8.0)

## 2018-03-26 LAB — I-STAT TROPONIN, ED: TROPONIN I, POC: 0 ng/mL (ref 0.00–0.08)

## 2018-03-26 LAB — CBC WITH DIFFERENTIAL/PLATELET
Basophils Absolute: 0 10*3/uL (ref 0.0–0.1)
Basophils Relative: 0 %
EOS ABS: 0.1 10*3/uL (ref 0.0–0.7)
Eosinophils Relative: 1 %
HEMATOCRIT: 43.9 % (ref 39.0–52.0)
Hemoglobin: 14.6 g/dL (ref 13.0–17.0)
Lymphocytes Relative: 21 %
Lymphs Abs: 1.3 10*3/uL (ref 0.7–4.0)
MCH: 30.7 pg (ref 26.0–34.0)
MCHC: 33.3 g/dL (ref 30.0–36.0)
MCV: 92.2 fL (ref 78.0–100.0)
Monocytes Absolute: 0.5 10*3/uL (ref 0.1–1.0)
Monocytes Relative: 8 %
Neutro Abs: 4.3 10*3/uL (ref 1.7–7.7)
Neutrophils Relative %: 70 %
Platelets: 196 10*3/uL (ref 150–400)
RBC: 4.76 MIL/uL (ref 4.22–5.81)
RDW: 13.5 % (ref 11.5–15.5)
WBC: 6.3 10*3/uL (ref 4.0–10.5)

## 2018-03-26 LAB — COMPREHENSIVE METABOLIC PANEL
ALK PHOS: 51 U/L (ref 38–126)
ALT: 42 U/L (ref 0–44)
AST: 36 U/L (ref 15–41)
Albumin: 4.2 g/dL (ref 3.5–5.0)
Anion gap: 8 (ref 5–15)
BUN: 15 mg/dL (ref 6–20)
CALCIUM: 9.3 mg/dL (ref 8.9–10.3)
CO2: 30 mmol/L (ref 22–32)
Chloride: 105 mmol/L (ref 98–111)
Creatinine, Ser: 1 mg/dL (ref 0.61–1.24)
GFR calc non Af Amer: >60 mL/min (ref >60)
Glucose, Bld: 94 mg/dL (ref 70–99)
Potassium: 4 mmol/L (ref 3.5–5.1)
SODIUM: 143 mmol/L (ref 135–145)
Total Bilirubin: 0.9 mg/dL (ref 0.3–1.2)
Total Protein: 7.6 g/dL (ref 6.5–8.1)

## 2018-03-26 LAB — LIPASE, BLOOD: Lipase: 27 U/L (ref 11–51)

## 2018-03-26 LAB — D-DIMER, QUANTITATIVE: D-Dimer, Quant: <0.27 ug/mL-FEU (ref 0.00–0.50)

## 2018-03-26 MED ORDER — SUCRALFATE 1 G PO TABS: 1.0000 g | ORAL_TABLET | Freq: Three times a day (TID) | ORAL | 0 refills | Status: AC

## 2018-03-26 MED ORDER — GI COCKTAIL ~~LOC~~
30.0000 mL | Freq: Once | ORAL | Status: AC
  Administered 2018-03-26: 30 mL via ORAL
  Filled 2018-03-26: qty 30

## 2018-03-26 MED ORDER — FAMOTIDINE 20 MG PO TABS
40.0000 mg | ORAL_TABLET | Freq: Once | ORAL | Status: AC
  Administered 2018-03-26: 40 mg via ORAL
  Filled 2018-03-26: qty 2

## 2018-03-26 MED ORDER — FAMOTIDINE 20 MG PO TABS: 40.0000 mg | ORAL_TABLET | Freq: Two times a day (BID) | ORAL | 0 refills | Status: AC

## 2018-03-26 NOTE — Discharge Instructions (Signed)
You were given Carafate and Pepcid to help with your symptoms or heartburn.  Please take medications as directed on your discharge paperwork.  I have prescribed a new medication for you today. It is important that when you pick the prescription up you discuss the potential interactions of this medication with other medications you are taking, including over the counter medications, with the pharmacists.   This new medication has potential side effects. Be sure to contact your primary care provider or return to the emergency department if you are experiencing new symptoms that you are unable to tolerate after starting the medication. You need to receive medical evaluation immediately if you start to experience blistering of the skin, rash, swelling, or difficulty breathing as these signs could indicate a more serious medication side effect.   Please follow up with your primary care provider within 5-7 days for re-evaluation of your symptoms. If you do not have a primary care provider, information for a healthcare clinic has been provided for you to make arrangements for follow up care. Please return to the emergency department for any new or worsening symptoms.

## 2018-03-26 NOTE — ED Provider Notes (Signed)
[facility] DEPT Provider Note   CSN: [id number] Arrival date & time: 03/26/18  1807     History   Chief Complaint Chief Complaint  Patient presents with  . Abdominal Pain  . Shoulder Pain  . Chest Pain    HPI Omar Murray is a 22 y.o. male.  HPI   Pt is a 22 y/o male with a h/o anxiety, depression, insomnia who presents to the ED today to be evaluated for epigastric abdominal pain, bilateral chest pain and left shoulder pain.  Patient states that last night he had a hamburger and [place] fries.  Shortly after eating this meal he began to have epigastric abdominal pain, bilateral chest pain and pain that radiated to his left shoulder.  He also had some nausea and some pain with inspiration during that time.  He states that he took some Tylenol which helped improve his symptoms.  Symptoms had completely resolved until he had lunch again today.  States he had a sub-sandwich for lunch and then began to have epigastric abdominal pain, bilateral chest pain and pain in his left shoulder.  He took Tylenol again and his symptoms have been improving. States pain feels "like a combination of a pinched nerve and indigestion".  Rates pain 4-5/10.  At worst it was greater than 10/10.  He reports that he has a history of heartburn but does not normally take medication for this.  He denies any shortness of breath. No diarrhea, constipation, urinary sxs, hematuria, hematochezia, or melena. No fevers or URI sxs.   Denies leg pain/swelling, hemoptysis, recent surgery/trauma, recent long travel, hormone use, personal hx of cancer, or hx of DVT/PE.   Past Medical History:  Diagnosis Date  . Insomnia     There are no active problems to display for this patient.   Past Surgical History:  Procedure Laterality Date  . WISDOM TOOTH EXTRACTION          Home Medications    Prior to Admission medications   Medication Sig Start Date End Date Taking? Authorizing Provider    acetaminophen (TYLENOL) 500 MG tablet Take 1,000 mg by mouth every 6 (six) hours as needed for moderate pain.    Yes [provider]  divalproex (DEPAKOTE) 500 MG DR tablet Take 1,500 mg by mouth daily.    Yes [provider]  lamoTRIgine (LAMICTAL) 100 MG tablet Take 100 mg by mouth daily.   Yes [provider]  Melatonin 10 MG TABS Take 10 mg by mouth at bedtime as needed (sleep).   Yes [provider]  famotidine (PEPCID) 20 MG tablet Take 2 tablets (40 mg total) by mouth 2 (two) times daily for 14 days. 03/26/18 04/09/18  Tremon Sainvil S, PA-C  sucralfate (CARAFATE) 1 g tablet Take 1 tablet (1 g total) by mouth 3 (three) times daily with meals for 14 days. 03/26/18 04/09/18  Jawan Chavarria S, PA-C    Family History No family history on file.  Social History Social History   Tobacco Use  . Smoking status: Never Smoker  . Smokeless tobacco: Never Used  Substance Use Topics  . Alcohol use: No  . Drug use: No     Allergies   Celexa [citalopram hydrobromide] and Hydrocodone   Review of Systems Review of Systems  Constitutional: Negative for chills and fever.  HENT: Negative for ear pain and sore throat.   Eyes: Negative for visual disturbance.  Respiratory: Negative for cough, shortness of breath and wheezing.  Cardiovascular: Positive for chest pain. Negative for palpitations and leg swelling.  Gastrointestinal: Positive for abdominal pain and nausea. Negative for blood in stool, constipation, diarrhea and vomiting.  Genitourinary: Negative for dysuria, flank pain, hematuria and urgency.  Musculoskeletal: Negative for back pain.       Left shoulder pain  Skin: Negative for color change and rash.  Neurological: Negative for dizziness, weakness, light-headedness, numbness and headaches.  All other systems reviewed and are negative.  Physical Exam Updated Vital Signs BP (!) 148/93   Pulse (!) 103   Temp 98.8 F (37.1 C) (Oral)   Resp 15    Ht 6' (1.829 m)   Wt 90.7 kg (200 lb)   SpO2 100%   BMI 27.12 kg/m   Physical Exam  Constitutional: He appears well-developed and well-nourished.  HENT:  Head: Normocephalic and atraumatic.  Mouth/Throat: Oropharynx is clear and moist.  Eyes: Conjunctivae are normal.  Neck: Neck supple.  Cardiovascular: Normal rate, regular rhythm, normal heart sounds and intact distal pulses.  No murmur heard. Pulmonary/Chest: Effort normal and breath sounds normal. No stridor. No respiratory distress. He has no wheezes.  No chest wall TTP.   Abdominal: Soft. Bowel sounds are normal. He exhibits no distension and no mass. There is no tenderness. There is no guarding.  Musculoskeletal: He exhibits no edema.  No calf TTP, erythema, swelling. TTP to left trapezius muscles.  Neurological: He is alert.  Skin: Skin is warm and dry. Capillary refill takes less than 2 seconds.  Psychiatric: He has a normal mood and affect.  Nursing note and vitals reviewed.  ED Treatments / Results  Labs (all labs ordered are listed, but only abnormal results are displayed) Labs Reviewed  URINALYSIS, ROUTINE W REFLEX MICROSCOPIC - Abnormal; Notable for the following components:      Result Value   Ketones, ur 5 (*)    All other components within normal limits  CBC WITH DIFFERENTIAL/PLATELET  COMPREHENSIVE METABOLIC PANEL  LIPASE, BLOOD  D-DIMER, QUANTITATIVE (NOT AT [facility])  I-STAT TROPONIN, ED    EKG EKG Interpretation  Date/Time:  Wednesday March 26 2018 19:15:38 EDT Ventricular Rate:  89 PR Interval:    QRS Duration: 90 QT Interval:  351 QTC Calculation: 427 R Axis:   97 Text Interpretation:  Sinus rhythm Borderline short PR interval Consider right ventricular hypertrophy ST elev, probable normal early repol pattern No old tracing to compare Confirmed by Deno Etienne [id number]) on 03/26/2018 8:05:12 PM   Radiology Dg Chest 2 View  Result Date: 03/26/2018 CLINICAL DATA:  Abdominal pain, chest pain EXAM:  CHEST - 2 VIEW COMPARISON:  05/11/2014 FINDINGS: Heart and mediastinal contours are within normal limits. No focal opacities or effusions. No acute bony abnormality. IMPRESSION: No active cardiopulmonary disease. Electronically Signed   By: Rolm Baptise M.D.   On: 03/26/2018 19:56    Procedures Procedures (including critical care time)  Medications Ordered in ED Medications  gi cocktail (Maalox,Lidocaine,Donnatal) (30 mLs Oral Given 03/26/18 1923)  famotidine (PEPCID) tablet 40 mg (40 mg Oral Given 03/26/18 1923)     Initial Impression / Assessment and Plan / ED Course  I have reviewed the triage vital signs and the nursing notes.  Pertinent labs & imaging results that were available during my care of the patient were reviewed by me and considered in my medical decision making (see chart for details).     Final Clinical Impressions(s) / ED Diagnoses   Final diagnoses:  Atypical chest pain  Abdominal pain,  unspecified abdominal location  Shoulder pain, unspecified chronicity, unspecified laterality   Patient is to be discharged with recommendation to follow up with PCP in regards to today's hospital visit. Chest pain is not likely of cardiac or pulmonary etiology d/t presentation, DDimer negative and pt very low risk, VSS, no tracheal deviation, no JVD or new murmur, RRR, breath sounds equal bilaterally, EKG without ischemic changes, NSR, negative troponin, and negative CXR.  Patient's chest pain and epigastric abdominal pain has resolved after receiving medications in the ED.  His abdomen is soft.  He has no signs of sepsis or Sirs.  Abdomen is soft and nontender on exam.  Doubt cholecystitis or other gallbladder pathology as his alk phos and total bilirubin are all within normal limits.  Doubt other emergent intra-abdominal pathology as all of his other lab work is benign.  No leukocytosis.  No anemia.  Normal lecture lites.  Normal kidney and hepatic function.  Normal lipase.  UA without  evidence of UTI.  We will discharge him home with symptomatic treatment with Carafate and Pepcid and have him follow-up with his primary care doctor in 1 week for reevaluation.  Advised him return to the ER for any new or worsening symptoms in the meantime.  All questions were answered and patient understands the plan and reasons to return immediately to the ED.  ED Discharge Orders        Ordered    sucralfate (CARAFATE) 1 g tablet  3 times daily with meals     03/26/18 2027    famotidine (PEPCID) 20 MG tablet  2 times daily     03/26/18 2027       Rodney Booze, PA-C 03/26/18 2045    Deno Etienne, DO 03/26/18 2147

## 2018-03-26 NOTE — ED Triage Notes (Signed)
Patient arrives with c/o left shoulder pain. Patient stated he ate food last night and had severe abdominal and chest pain and radiated to left shoulder and neck. Pain subsided and then today when eating lunch the same thing happened. No abdominal surgeries. +Nausea, -vomiting, -diarrhea.

## 2018-07-20 DIAGNOSIS — F4310 Post-traumatic stress disorder, unspecified: Secondary | ICD-10-CM | POA: Insufficient documentation

## 2018-07-30 ENCOUNTER — Ambulatory Visit: Payer: Self-pay | Admitting: Psychiatry

## 2018-08-04 ENCOUNTER — Other Ambulatory Visit: Payer: Self-pay

## 2018-08-04 MED ORDER — DIVALPROEX SODIUM 500 MG PO DR TAB: 1500.0000 mg | DELAYED_RELEASE_TABLET | Freq: Every day | ORAL | 0 refills | Status: DC

## 2018-08-06 ENCOUNTER — Ambulatory Visit: Payer: BLUE CROSS/BLUE SHIELD | Admitting: Psychiatry

## 2018-08-06 DIAGNOSIS — F4310 Post-traumatic stress disorder, unspecified: Secondary | ICD-10-CM

## 2018-08-06 DIAGNOSIS — F3160 Bipolar disorder, current episode mixed, unspecified: Secondary | ICD-10-CM | POA: Diagnosis not present

## 2018-08-06 DIAGNOSIS — F411 Generalized anxiety disorder: Secondary | ICD-10-CM

## 2018-08-06 DIAGNOSIS — F509 Eating disorder, unspecified: Secondary | ICD-10-CM

## 2018-08-06 MED ORDER — DIVALPROEX SODIUM ER 500 MG PO TB24: 1500.0000 mg | ORAL_TABLET | ORAL | 2 refills | Status: DC

## 2018-08-06 MED ORDER — LAMOTRIGINE 100 MG PO TABS: 100.0000 mg | ORAL_TABLET | Freq: Every day | ORAL | 2 refills | Status: DC

## 2018-08-06 NOTE — Progress Notes (Signed)
[facility] Med Check  Patient ID: Omar Murray,  MRN: [id number]  PCP: Patient, No Pcp Per  Date of Evaluation: 08/06/2018 Time spent:20 minutes  Chief Complaint:   HISTORY/CURRENT STATUS: HPI patient is a 22 year old white male last seen on May 07, 2018.  Diagnoses include bipolar mixed/rapid cycle-depressed.  Anxiety, PTSD, insomnia, possible eating disorder, Tourette's. he was doing well at his last visit so no medication changes. Continues to do well.  Does have a new job which allows [place] to [place] in [place].  Individual Medical History/ Review of Systems: Changes? :No   Allergies: Celexa [citalopram hydrobromide] and Hydrocodone  Current Medications:  Current Outpatient Medications:  .  lamoTRIgine (LAMICTAL) 100 MG tablet, Take 1 tablet (100 mg total) by mouth daily., Disp: 30 tablet, Rfl: 2 .  Melatonin 10 MG TABS, Take 10 mg by mouth at bedtime as needed (sleep)., Disp: , Rfl:  .  acetaminophen (TYLENOL) 500 MG tablet, Take 1,000 mg by mouth every 6 (six) hours as needed for moderate pain. , Disp: , Rfl:  .  divalproex (DEPAKOTE ER) 500 MG 24 hr tablet, Take 3 tablets (1,500 mg total) by mouth as directed., Disp: 90 tablet, Rfl: 2 .  famotidine (PEPCID) 20 MG tablet, Take 2 tablets (40 mg total) by mouth 2 (two) times daily for 14 days., Disp: 56 tablet, Rfl: 0 .  sucralfate (CARAFATE) 1 g tablet, Take 1 tablet (1 g total) by mouth 3 (three) times daily with meals for 14 days., Disp: 42 tablet, Rfl: 0 Medication Side Effects: none  Family Medical/ Social History: Changes? No  MENTAL HEALTH EXAM:  There were no vitals taken for this visit.There is no height or weight on file to calculate BMI.  General Appearance: Casual  Eye Contact:  Good  Speech:  Normal Rate  Volume:  Normal  Mood:  Euthymic  Affect:  Appropriate  Thought Process:  Linear  Orientation:  Full (Time, Place, and Person)  Thought Content: Logical   Suicidal Thoughts:  No  Homicidal  Thoughts:  No  Memory:  WNL  Judgement:  Good  Insight:  Good  Psychomotor Activity:  Normal  Concentration:  Concentration: Good  Recall:  Good  Fund of Knowledge: Good  Language: Good  Assets:  Desire for Improvement  ADL's:  Intact  Cognition: WNL  Prognosis:  Good    DIAGNOSES:    ICD-10-CM   1. Bipolar affective disorder, current episode mixed, current episode severity unspecified (HCC) F31.60   2. Anxiety state F41.1   3. PTSD (post-traumatic stress disorder) F43.10   4. Eating disorder, unspecified type F50.9     Receiving Psychotherapy: No    RECOMMENDATIONS: We will keep him on his same medications which are Depakote ER 500 mg 3 a day, Lamictal 100 mg once a day.  Stevens-Johnson syndrome was discussed with patient.  Patient will return in 3 months.   Anne Fulay Haset [place], PA-C

## 2018-11-06 ENCOUNTER — Ambulatory Visit: Payer: BLUE CROSS/BLUE SHIELD | Admitting: Psychiatry

## 2018-11-18 ENCOUNTER — Ambulatory Visit: Payer: BLUE CROSS/BLUE SHIELD | Admitting: Psychiatry

## 2019-02-10 IMAGING — CR DG CHEST 2V
2 series · 2 of 2 positions shown · non-contrast
Comparison: 05/11/2014

CLINICAL DATA: Abdominal pain, chest pain

EXAM:
CHEST - 2 VIEW

[w chest pa]
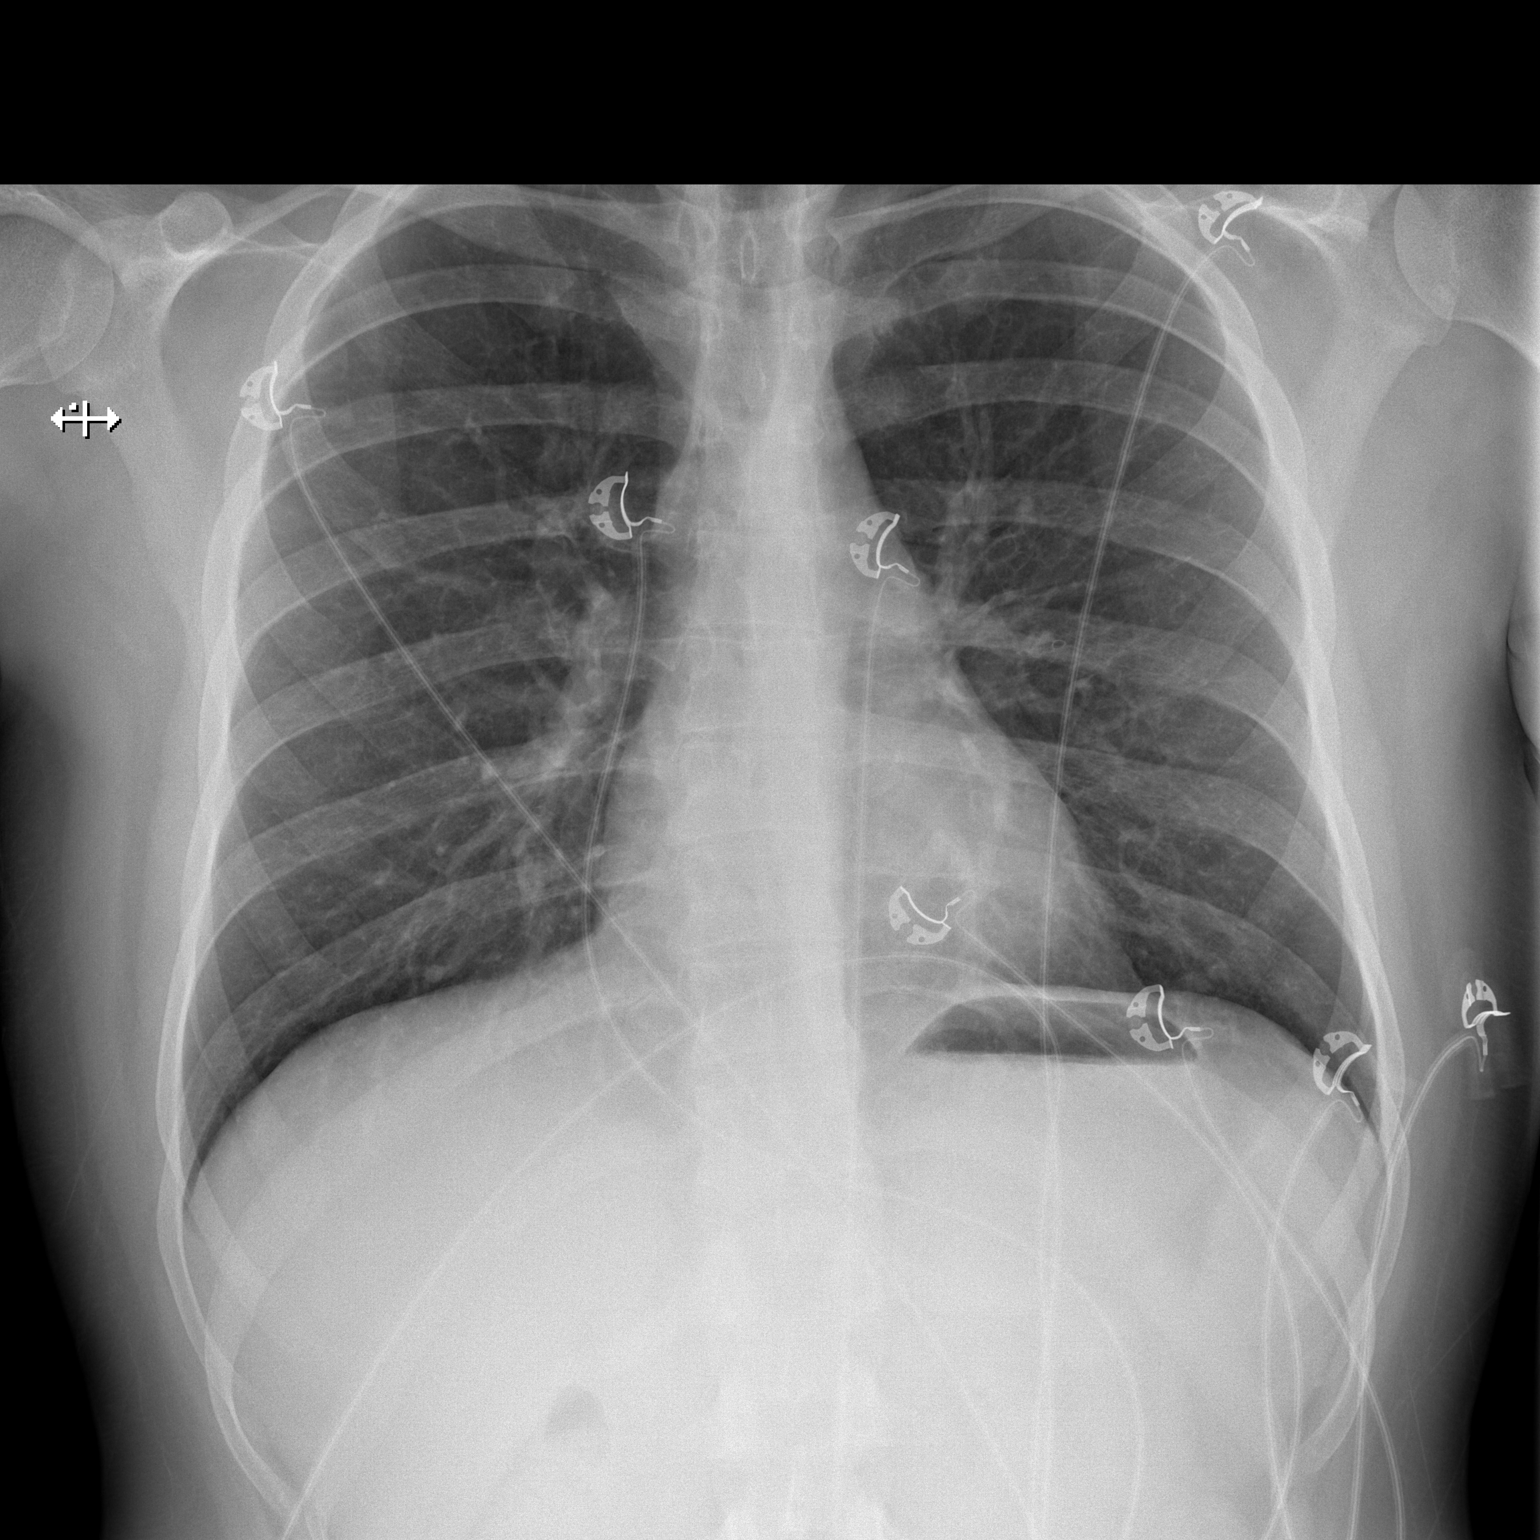

[w chest lat]
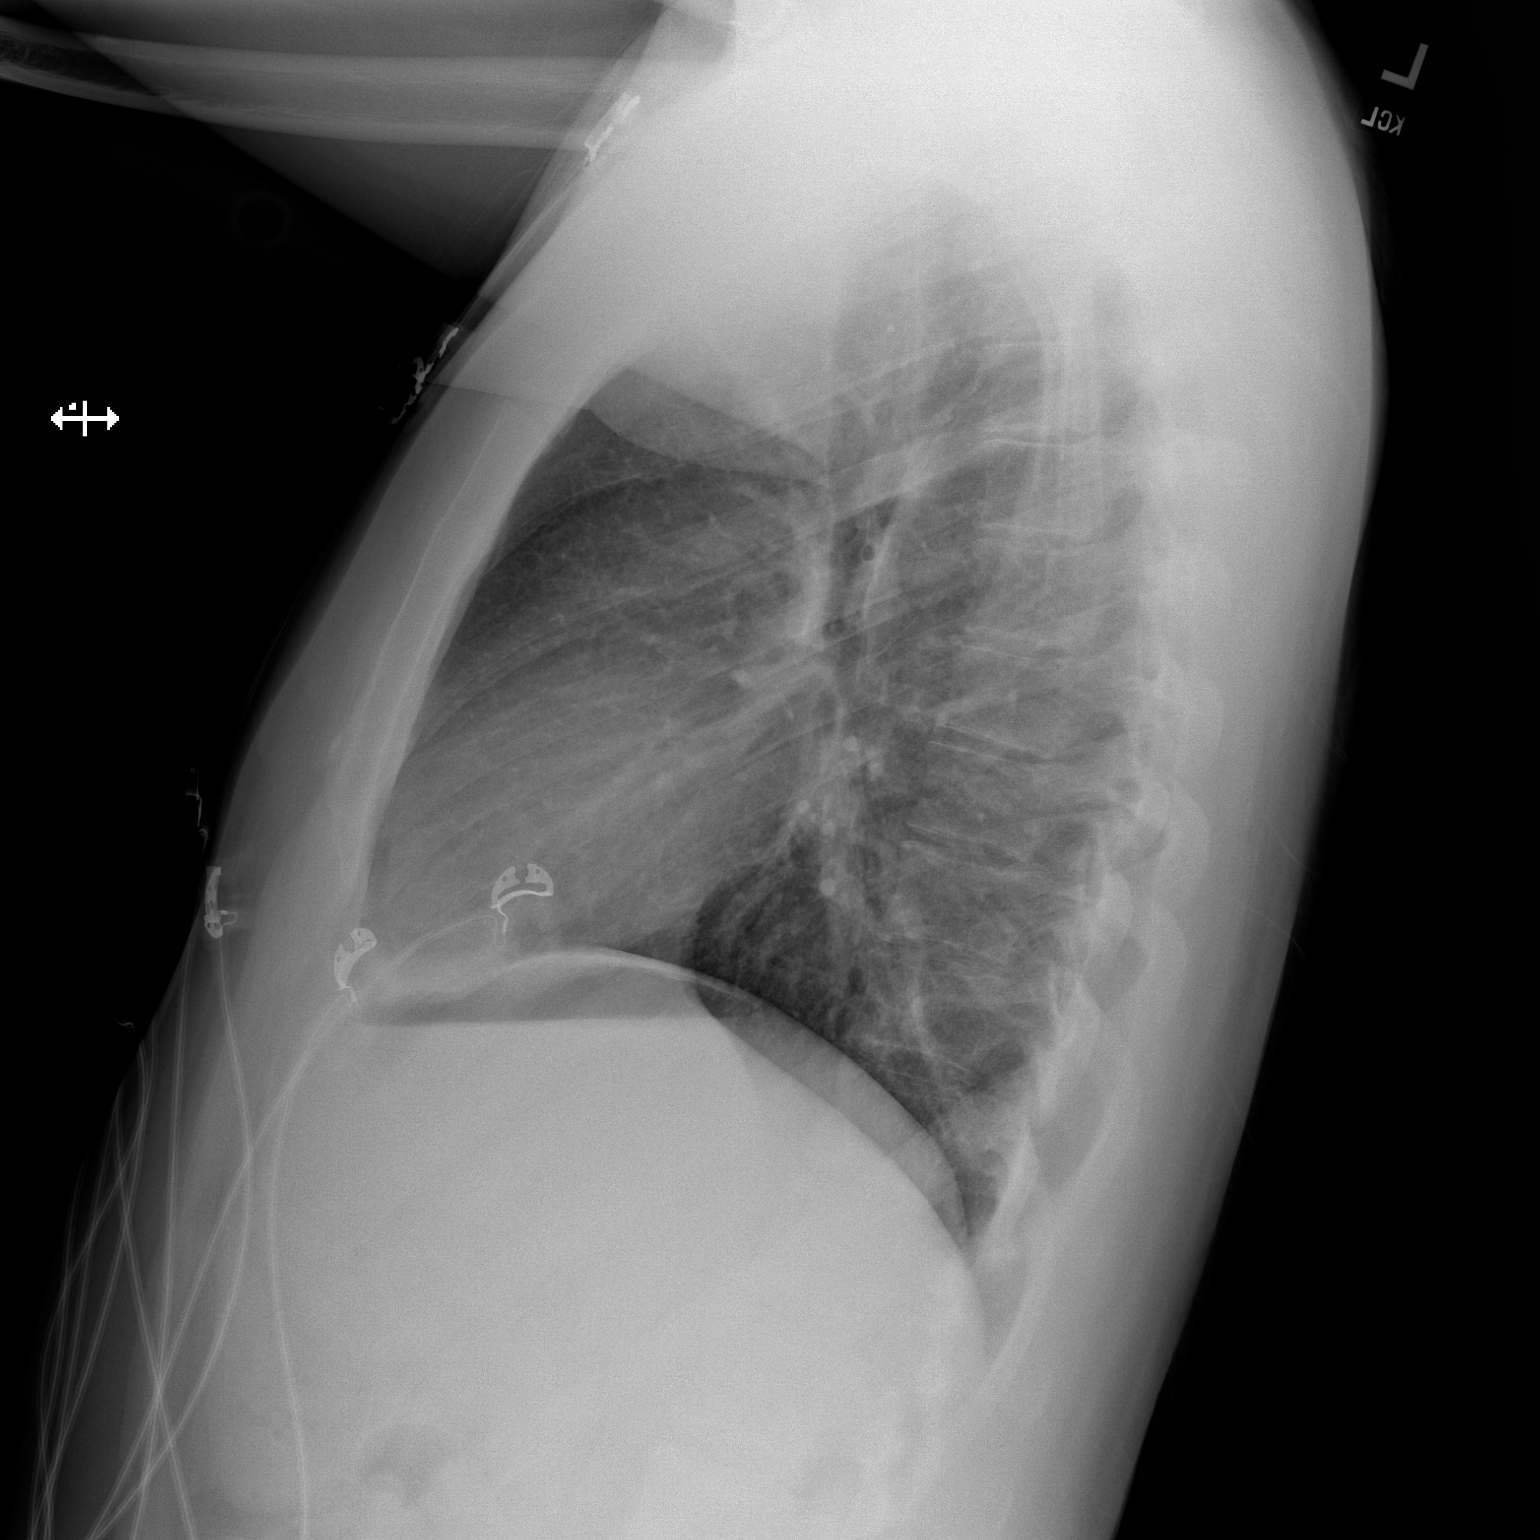

[2 of 2 positions shown; findings below may reference images not displayed]

FINDINGS: Heart and mediastinal contours are within normal limits. No focal
opacities or effusions. No acute bony abnormality.
IMPRESSION: No active cardiopulmonary disease.

## 2019-03-09 ENCOUNTER — Other Ambulatory Visit: Payer: Self-pay

## 2019-03-09 ENCOUNTER — Telehealth: Payer: Self-pay | Admitting: Psychiatry

## 2019-03-09 MED ORDER — DIVALPROEX SODIUM ER 500 MG PO TB24: 1500.0000 mg | ORAL_TABLET | ORAL | 2 refills | Status: DC

## 2019-03-09 NOTE — Telephone Encounter (Signed)
3 month supply submitted. Will notify patient.

## 2019-03-09 NOTE — Telephone Encounter (Signed)
Patient called and said that his insurance is out of network and he is trying to get in with another psychiatrist but it will be at least six months. Can we fill his depokaete er 500mg  3 tabs dailty to [facility] to [facility] on [address]

## 2019-03-09 NOTE — Telephone Encounter (Signed)
Let's do a 3 month supply and let him know he'll have to get in w/ someone or else come back here in 3 months. If he hasn't already tried, call [facility], Dr. Toy Care, Dr. Gerald Stabs Aiken's group.

## 2019-04-14 ENCOUNTER — Other Ambulatory Visit: Payer: Self-pay

## 2019-04-14 MED ORDER — LAMOTRIGINE 100 MG PO TABS: 100.0000 mg | ORAL_TABLET | Freq: Every day | ORAL | 2 refills | Status: DC

## 2019-06-05 ENCOUNTER — Other Ambulatory Visit: Payer: Self-pay | Admitting: Physician Assistant

## 2019-07-15 ENCOUNTER — Other Ambulatory Visit: Payer: Self-pay | Admitting: Physician Assistant

## 2020-09-26 ENCOUNTER — Encounter (HOSPITAL_COMMUNITY): Payer: Self-pay | Admitting: Licensed Clinical Social Worker

## 2020-09-26 ENCOUNTER — Other Ambulatory Visit: Payer: Self-pay

## 2020-09-26 ENCOUNTER — Ambulatory Visit (INDEPENDENT_AMBULATORY_CARE_PROVIDER_SITE_OTHER): Payer: 59 | Admitting: Licensed Clinical Social Worker

## 2020-09-26 DIAGNOSIS — F3181 Bipolar II disorder: Secondary | ICD-10-CM | POA: Diagnosis not present

## 2020-09-26 DIAGNOSIS — F411 Generalized anxiety disorder: Secondary | ICD-10-CM
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
## 2020-09-28 ENCOUNTER — Other Ambulatory Visit: Payer: Self-pay

## 2020-09-28 ENCOUNTER — Encounter (HOSPITAL_COMMUNITY): Payer: Self-pay | Admitting: Physician Assistant

## 2020-09-28 ENCOUNTER — Telehealth (INDEPENDENT_AMBULATORY_CARE_PROVIDER_SITE_OTHER): Payer: 59 | Admitting: Physician Assistant

## 2020-09-28 DIAGNOSIS — F5105 Insomnia due to other mental disorder: Secondary | ICD-10-CM | POA: Diagnosis not present

## 2020-09-28 DIAGNOSIS — F99 Mental disorder, not otherwise specified: Secondary | ICD-10-CM | POA: Diagnosis not present

## 2020-09-28 DIAGNOSIS — F411 Generalized anxiety disorder: Secondary | ICD-10-CM | POA: Diagnosis not present

## 2020-09-28 DIAGNOSIS — F3181 Bipolar II disorder: Secondary | ICD-10-CM

## 2020-09-28 MED ORDER — QUETIAPINE FUMARATE 50 MG PO TABS: 50.0000 mg | ORAL_TABLET | Freq: Every day | ORAL | 2 refills | Status: DC

## 2020-09-28 MED ORDER — LAMOTRIGINE 100 MG PO TABS: 100.0000 mg | ORAL_TABLET | Freq: Every day | ORAL | 2 refills | Status: DC
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
## 2020-12-19 ENCOUNTER — Telehealth (HOSPITAL_COMMUNITY): Payer: Self-pay | Admitting: *Deleted

## 2020-12-19 NOTE — Telephone Encounter (Signed)
Rx REFILL REQUEST: lamoTRIgine (LAMICTAL) 100 MG tablet  & QUEtiapine (SEROQUEL) 50 MG tablet

## 2020-12-20 ENCOUNTER — Other Ambulatory Visit (HOSPITAL_COMMUNITY): Payer: Self-pay | Admitting: Physician Assistant

## 2020-12-20 DIAGNOSIS — F99 Mental disorder, not otherwise specified: Secondary | ICD-10-CM

## 2020-12-20 DIAGNOSIS — F5105 Insomnia due to other mental disorder: Secondary | ICD-10-CM

## 2020-12-20 DIAGNOSIS — F3181 Bipolar II disorder: Secondary | ICD-10-CM

## 2020-12-20 DIAGNOSIS — F411 Generalized anxiety disorder: Secondary | ICD-10-CM

## 2020-12-20 MED ORDER — LAMOTRIGINE 100 MG PO TABS: 100.0000 mg | ORAL_TABLET | Freq: Every day | ORAL | 2 refills | Status: DC

## 2020-12-20 MED ORDER — QUETIAPINE FUMARATE 50 MG PO TABS: 50.0000 mg | ORAL_TABLET | Freq: Every day | ORAL | 2 refills | Status: DC

## 2020-12-20 NOTE — Progress Notes (Signed)
Provider was contacted by Rushie Chestnut, RMA regarding refill requests. Patient's medication will be e-prescribed to pharmacy of choice.

## 2020-12-20 NOTE — Telephone Encounter (Signed)
Provider was contacted by Direce E McIntyre, RMA regarding refill requests. Patient's medication will be e-prescribed to pharmacy of choice. 

## 2020-12-27 ENCOUNTER — Telehealth (HOSPITAL_COMMUNITY): Payer: 59 | Admitting: Family

## 2021-01-25 ENCOUNTER — Other Ambulatory Visit: Payer: Self-pay

## 2021-01-25 ENCOUNTER — Telehealth (INDEPENDENT_AMBULATORY_CARE_PROVIDER_SITE_OTHER): Payer: 59 | Admitting: Physician Assistant

## 2021-01-25 DIAGNOSIS — F99 Mental disorder, not otherwise specified: Secondary | ICD-10-CM

## 2021-01-25 DIAGNOSIS — F410 Panic disorder [episodic paroxysmal anxiety] without agoraphobia: Secondary | ICD-10-CM | POA: Diagnosis not present

## 2021-01-25 DIAGNOSIS — F3181 Bipolar II disorder: Secondary | ICD-10-CM

## 2021-01-25 DIAGNOSIS — F5105 Insomnia due to other mental disorder: Secondary | ICD-10-CM

## 2021-01-25 DIAGNOSIS — F411 Generalized anxiety disorder: Secondary | ICD-10-CM

## 2021-01-27 ENCOUNTER — Encounter (HOSPITAL_COMMUNITY): Payer: Self-pay | Admitting: Physician Assistant

## 2021-01-27 MED ORDER — CLONAZEPAM 0.5 MG PO TABS: 0.5000 mg | ORAL_TABLET | Freq: Every day | ORAL | 1 refills | Status: DC

## 2021-01-27 MED ORDER — QUETIAPINE FUMARATE 50 MG PO TABS: 50.0000 mg | ORAL_TABLET | Freq: Every day | ORAL | 2 refills | Status: DC

## 2021-01-27 MED ORDER — LAMOTRIGINE 100 MG PO TABS: 100.0000 mg | ORAL_TABLET | Freq: Every day | ORAL | 2 refills | Status: DC

## 2021-01-27 NOTE — Progress Notes (Signed)
BH MD/PA/NP OP Progress Note  Virtual Visit via Video Note  I connected with Omar Murray on 01/27/21 at  3:00 PM EDT by a video enabled telemedicine application and verified that I am speaking with the correct person using two identifiers.  Location: Patient: Home Provider: Clinic   I discussed the limitations of evaluation and management by telemedicine and the availability of in person appointments. The patient expressed understanding and agreed to proceed.  Follow Up Instructions:  I discussed the assessment and treatment plan with the patient. The patient was provided an opportunity to ask questions and all were answered. The patient agreed with the plan and demonstrated an understanding of the instructions.   The patient was advised to call back or seek an in-person evaluation if the symptoms worsen or if the condition fails to improve as anticipated.  I provided 21 minutes of non-face-to-face time during this encounter.  Omar Hatchet, PA   01/25/2021 5:53 PM Omar Murray  MRN:  [id number]  Chief Complaint: Follow up and medication management  HPI:    Omar Murray is a 25 year old male with a past psychiatric history significant for generalized anxiety disorder, bipolar 2 disorder, insomnia, and panic disorder who presents to [facility] via virtual video visit for follow-up and medication management. Patient is currently being managed on the following medications:  Lamotrigine 100 mg daily Seroquel 50 mg at bedtime Clonazepam 0.5 mg as needed  Patient reports no issues or concerns with his current regimen of medications and denies the need for dosage adjustments at this time.  Patient is requesting refills on all his medications following the conclusion of the encounter.  Patient states that his medications are working perfectly together.  Patient denies any major issues regarding his mental health at this time.  Patient's current  stressor includes financial instability/hardships, however, patient recently acquired a new job in April.  Patient states he has also been managing his mood through dieting and proper exercise.  Patient is pleasant, calm, cooperative, and fully engaged in conversation during the encounter.  Patient reports that he is in a good mood today.  Patient denies suicidal or homicidal ideations.  He further denies auditory or visual hallucinations.  Patient endorses good sleep and receives on average 6 to 7 hours of sleep each night.  Patient endorses good appetite and eats on average 1-2 meals per day.  Patient denies alcohol consumption, tobacco use, and illicit drug use.  Visit Diagnosis:    ICD-10-CM   1. Generalized anxiety disorder  F41.1 clonazePAM (KLONOPIN) 0.5 MG tablet    lamoTRIgine (LAMICTAL) 100 MG tablet  2. Bipolar 2 disorder (HCC)  F31.81 lamoTRIgine (LAMICTAL) 100 MG tablet    QUEtiapine (SEROQUEL) 50 MG tablet  3. Insomnia due to other mental disorder  F51.05 QUEtiapine (SEROQUEL) 50 MG tablet   F99   4. Panic disorder  F41.0 clonazePAM (KLONOPIN) 0.5 MG tablet    Past Psychiatric History:  Bipolar disorder Generalized anxiety disorder Insomnia PTSD  Past Medical History:  Past Medical History:  Diagnosis Date  . Insomnia     Past Surgical History:  Procedure Laterality Date  . WISDOM TOOTH EXTRACTION      Family Psychiatric History:  Mother - Bipolar Disorder  Family History: History reviewed. No pertinent family history.  Social History:  Social History   Socioeconomic History  . Marital status: Single    Spouse name: Not on file  . Number of children: Not on file  .  Years of education: Not on file  . Highest education level: Not on file  Occupational History  . Not on file  Tobacco Use  . Smoking status: Never Smoker  . Smokeless tobacco: Never Used  Substance and Sexual Activity  . Alcohol use: Yes    Comment: social   . Drug use: No  . Sexual  activity: Not Currently  Other Topics Concern  . Not on file  Social History Narrative  . Not on file   Social Determinants of Health   Financial Resource Strain: Low Risk   . Difficulty of Paying Living Expenses: Not very hard  Food Insecurity: No Food Insecurity  . Worried About Programme researcher, broadcasting/film/video in the Last Year: Never true  . Ran Out of Food in the Last Year: Never true  Transportation Needs: No Transportation Needs  . Lack of Transportation (Medical): No  . Lack of Transportation (Non-Medical): No  Physical Activity: Sufficiently Active  . Days of Exercise per Week: 5 days  . Minutes of Exercise per Session: 60 min  Stress: No Stress Concern Present  . Feeling of Stress : Only a little  Social Connections: Socially Isolated  . Frequency of Communication with Friends and Family: Twice a week  . Frequency of Social Gatherings with Friends and Family: Twice a week  . Attends Religious Services: Never  . Active Member of Clubs or Organizations: No  . Attends Banker Meetings: Never  . Marital Status: Never married    Allergies:  Allergies  Allergen Reactions  . Celexa [Citalopram Hydrobromide] Hives  . Hydrocodone Nausea And Vomiting    Metabolic Disorder Labs: No results found for: HGBA1C, MPG No results found for: PROLACTIN No results found for: CHOL, TRIG, HDL, CHOLHDL, VLDL, LDLCALC No results found for: TSH  Therapeutic Level Labs: No results found for: LITHIUM No results found for: VALPROATE No components found for:  CBMZ  Current Medications: Current Outpatient Medications  Medication Sig Dispense Refill  . clonazePAM (KLONOPIN) 0.5 MG tablet Take 1 tablet (0.5 mg total) by mouth at bedtime. 30 tablet 1  . acetaminophen (TYLENOL) 500 MG tablet Take 1,000 mg by mouth every 6 (six) hours as needed for moderate pain.     . famotidine (PEPCID) 20 MG tablet Take 2 tablets (40 mg total) by mouth 2 (two) times daily for 14 days. 56 tablet 0  .  lamoTRIgine (LAMICTAL) 100 MG tablet Take 1 tablet (100 mg total) by mouth daily. 30 tablet 2  . Melatonin 10 MG TABS Take 10 mg by mouth at bedtime as needed (sleep).    . QUEtiapine (SEROQUEL) 50 MG tablet Take 1 tablet (50 mg total) by mouth at bedtime. 30 tablet 2  . sucralfate (CARAFATE) 1 g tablet Take 1 tablet (1 g total) by mouth 3 (three) times daily with meals for 14 days. 42 tablet 0   No current facility-administered medications for this visit.     Musculoskeletal: Strength & Muscle Tone: Unable to assess due to telemedicine visit Gait & Station: Unable to assess due to telemedicine visit Patient leans: Unable to assess due to telemedicine visit  Psychiatric Specialty Exam: Review of Systems  Psychiatric/Behavioral: Negative for decreased concentration, dysphoric mood, hallucinations, self-injury, sleep disturbance and suicidal ideas. The patient is not nervous/anxious and is not hyperactive.     There were no vitals taken for this visit.There is no height or weight on file to calculate BMI.  General Appearance: Well Groomed  Eye Contact:  Good  Speech:  Clear and Coherent and Normal Rate  Volume:  Normal  Mood:  Euthymic  Affect:  Appropriate  Thought Process:  Coherent, Goal Directed and Descriptions of Associations: Intact  Orientation:  Full (Time, Place, and Person)  Thought Content: WDL   Suicidal Thoughts:  No  Homicidal Thoughts:  No  Memory:  Immediate;   Good Recent;   Good Remote;   Good  Judgement:  Good  Insight:  Good  Psychomotor Activity:  Normal  Concentration:  Concentration: Good and Attention Span: Good  Recall:  Good  Fund of Knowledge: Good  Language: Good  Akathisia:  NA  Handed:  Left  AIMS (if indicated): not done  Assets:  Communication Skills Desire for Improvement Housing Social Support  ADL's:  Intact  Cognition: WNL  Sleep:  Good   Screenings: GAD-7   Flowsheet Row Video Visit from 01/25/2021 in [facility] Video Visit from 09/28/2020 in [facility]  Total GAD-7 Score 4 6    PHQ2-9   Flowsheet Row Video Visit from 01/25/2021 in [facility] Counselor from 09/26/2020 in [facility]  PHQ-2 Total Score 1 2  PHQ-9 Total Score -- 5    Flowsheet Row Video Visit from 01/25/2021 in [facility]  C-SSRS RISK CATEGORY No Risk      Assessment and Plan:   Omar Murray is a 25 year old male with a past psychiatric history significant for generalized anxiety disorder, bipolar 2 disorder, insomnia, and panic disorder who presents to [facility] via virtual video visit for follow-up and medication management. Patient has no issues or concerns regarding his current medication regimen and denies the need for dosage adjustments at this time.  Patient is requesting refills on all his medications following the conclusion of the encounter.  Patient's medications will be e-prescribed to pharmacy of choice.  1. Generalized anxiety disorder  - clonazePAM (KLONOPIN) 0.5 MG tablet; Take 1 tablet (0.5 mg total) by mouth at bedtime.  Dispense: 30 tablet; Refill: 1 - lamoTRIgine (LAMICTAL) 100 MG tablet; Take 1 tablet (100 mg total) by mouth daily.  Dispense: 30 tablet; Refill: 2  2. Bipolar 2 disorder (HCC)  - lamoTRIgine (LAMICTAL) 100 MG tablet; Take 1 tablet (100 mg total) by mouth daily.  Dispense: 30 tablet; Refill: 2 - QUEtiapine (SEROQUEL) 50 MG tablet; Take 1 tablet (50 mg total) by mouth at bedtime.  Dispense: 30 tablet; Refill: 2  3. Insomnia due to other mental disorder  - QUEtiapine (SEROQUEL) 50 MG tablet; Take 1 tablet (50 mg total) by mouth at bedtime.  Dispense: 30 tablet; Refill: 2  4. Panic disorder  - clonazePAM (KLONOPIN) 0.5 MG tablet; Take 1 tablet (0.5 mg total) by mouth at bedtime.  Dispense: 30 tablet; Refill: 1  Patient to  follow-up in 3 months  Omar Hatchet, PA 01/27/2021, 5:53 PM

## 2021-04-14 ENCOUNTER — Other Ambulatory Visit (HOSPITAL_COMMUNITY): Payer: Self-pay | Admitting: Physician Assistant

## 2021-04-26 ENCOUNTER — Telehealth (INDEPENDENT_AMBULATORY_CARE_PROVIDER_SITE_OTHER): Payer: 59 | Admitting: Family

## 2021-04-26 ENCOUNTER — Encounter (HOSPITAL_COMMUNITY): Payer: Self-pay | Admitting: Family

## 2021-04-26 ENCOUNTER — Other Ambulatory Visit: Payer: Self-pay

## 2021-04-26 DIAGNOSIS — F411 Generalized anxiety disorder: Secondary | ICD-10-CM | POA: Diagnosis not present

## 2021-04-26 DIAGNOSIS — F410 Panic disorder [episodic paroxysmal anxiety] without agoraphobia: Secondary | ICD-10-CM | POA: Diagnosis not present

## 2021-04-26 MED ORDER — CLONAZEPAM 0.5 MG PO TABS: 0.5000 mg | ORAL_TABLET | Freq: Every day | ORAL | 0 refills | Status: DC

## 2021-04-26 NOTE — Progress Notes (Signed)
Virtual Visit via Telephone Note  I connected with Omar Murray on 04/26/21 at  2:15 PM EDT by telephone and verified that I am speaking with the correct person using two identifiers.  Location: Patient: home Provider: Office   I discussed the limitations, risks, security and privacy concerns of performing an evaluation and management service by telephone and the availability of in person appointments. I also discussed with the patient that there may be a patient responsible charge related to this service. The patient expressed understanding and agreed to proceed.   I discussed the assessment and treatment plan with the patient. The patient was provided an opportunity to ask questions and all were answered. The patient agreed with the plan and demonstrated an understanding of the instructions.   The patient was advised to call back or seek an in-person evaluation if the symptoms worsen or if the condition fails to improve as anticipated.  I provided 15 minutes of non-face-to-face time during this encounter.   Omar Rack, NP   BH MD/PA/NP OP Progress Note  04/26/2021 1:51 PM Omar Murray  MRN:  [id number]  Chief Complaint:  Omar Murray  reported " I am doing okay."   HPI:  Omar Murray was evaluated via telemetry assessment.  Follow-up appointment for medication management.  Charted history with bipolar disorder, generalized anxiety disorder and panic disorder.  Reported taking Lamictal 100 mg daily and Seroquel 50 mg nightly.  Denied any medication refill with mood stabilization medications however is requesting a refill with Klonopin 0.5 mg.  Omar Murray reports he recently moved to [place] [place] to attend graduate school.  States he is studying [employer] home production this fall.  Denying suicidal or homicidal ideations.  Denies auditory or visual hallucinations.  States he was followed by [facility] however due to his current insurance has not been able to  follow-up.  States he currently sees therapist Dr. Albin Felling reports therapy sessions are going well.   Visit Diagnosis: No diagnosis found.  Past Psychiatric History:   Past Medical History:  Past Medical History:  Diagnosis Date   Insomnia     Past Surgical History:  Procedure Laterality Date   WISDOM TOOTH EXTRACTION      Family Psychiatric History:   Family History: No family history on file.  Social History:  Social History   Socioeconomic History   Marital status: Single    Spouse name: Not on file   Number of children: Not on file   Years of education: Not on file   Highest education level: Not on file  Occupational History   Not on file  Tobacco Use   Smoking status: Never   Smokeless tobacco: Never  Substance and Sexual Activity   Alcohol use: Yes    Comment: social    Drug use: No   Sexual activity: Not Currently  Other Topics Concern   Not on file  Social History Narrative   Not on file   Social Determinants of Health   Financial Resource Strain: Low Risk    Difficulty of Paying Living Expenses: Not very hard  Food Insecurity: No Food Insecurity   Worried About Running Out of Food in the Last Year: Never true   Ran Out of Food in the Last Year: Never true  Transportation Needs: No Transportation Needs   Lack of Transportation (Medical): No   Lack of Transportation (Non-Medical): No  Physical Activity: Sufficiently Active   Days of Exercise per Week: 5 days  Minutes of Exercise per Session: 60 min  Stress: No Stress Concern Present   Feeling of Stress : Only a little  Social Connections: Socially Isolated   Frequency of Communication with Friends and Family: Twice a week   Frequency of Social Gatherings with Friends and Family: Twice a week   Attends Religious Services: Never   Database administrator or Organizations: No   Attends Banker Meetings: Never   Marital Status: Never married    Allergies:  Allergies   Allergen Reactions   Celexa [Citalopram Hydrobromide] Hives   Hydrocodone Nausea And Vomiting    Metabolic Disorder Labs: No results found for: HGBA1C, MPG No results found for: PROLACTIN No results found for: CHOL, TRIG, HDL, CHOLHDL, VLDL, LDLCALC No results found for: TSH  Therapeutic Level Labs: No results found for: LITHIUM No results found for: VALPROATE No components found for:  CBMZ  Current Medications: Current Outpatient Medications  Medication Sig Dispense Refill   acetaminophen (TYLENOL) 500 MG tablet Take 1,000 mg by mouth every 6 (six) hours as needed for moderate pain.      clonazePAM (KLONOPIN) 0.5 MG tablet Take 1 tablet (0.5 mg total) by mouth at bedtime. 30 tablet 1   famotidine (PEPCID) 20 MG tablet Take 2 tablets (40 mg total) by mouth 2 (two) times daily for 14 days. 56 tablet 0   lamoTRIgine (LAMICTAL) 100 MG tablet Take 1 tablet (100 mg total) by mouth daily. 30 tablet 2   Melatonin 10 MG TABS Take 10 mg by mouth at bedtime as needed (sleep).     QUEtiapine (SEROQUEL) 50 MG tablet Take 1 tablet (50 mg total) by mouth at bedtime. 30 tablet 2   sucralfate (CARAFATE) 1 g tablet Take 1 tablet (1 g total) by mouth 3 (three) times daily with meals for 14 days. 42 tablet 0   No current facility-administered medications for this visit.     Musculoskeletal:   Psychiatric Specialty Exam: Review of Systems  There were no vitals taken for this visit.There is no height or weight on file to calculate BMI.  General Appearance: NA  Eye Contact:  NA  Speech:  Clear and Coherent  Volume:  Decreased  Mood:  Anxious  Affect:  Congruent  Thought Process:  Coherent  Orientation:  Full (Time, Place, and Person)  Thought Content: Logical   Suicidal Thoughts:  No  Homicidal Thoughts:  No  Memory:  Immediate;   Fair Recent;   Fair  Judgement:  Fair  Insight:  Fair  Psychomotor Activity:  Normal  Concentration:  Concentration: Fair  Recall:  [employer] of  Knowledge: Fair  Language: Good  Akathisia:    Handed:    AIMS (if indicated): done  Assets:  Communication Skills Resilience Social Support  ADL's:  Intact  Cognition: WNL  Sleep:  Good   Screenings: GAD-7    Flowsheet Row Video Visit from 01/25/2021 in [facility] Video Visit from 09/28/2020 in [facility]  Total GAD-7 Score 4 6      PHQ2-9    Flowsheet Row Video Visit from 01/25/2021 in [facility] Counselor from 09/26/2020 in [facility]  PHQ-2 Total Score 1 2  PHQ-9 Total Score -- 5      Flowsheet Row Video Visit from 01/25/2021 in [facility]  C-SSRS RISK CATEGORY No Risk        Assessment and Plan:  Generalized anxiety disorder: Bipolar 2 disorder:   Continue Lamictal 100 mg p.o. daily Continue Seroquel 50 mg p.o. nightly -declined refills with above medications at this time.  Will make Klonopin 0.5 mg 30 tablets no refills available    Patient to follow-up in 1 month-for controlled medication   Omar Rack, NP 04/26/2021, 1:51 PM

## 2021-04-27 ENCOUNTER — Telehealth (HOSPITAL_COMMUNITY): Payer: 59 | Admitting: Physician Assistant

## 2021-05-26 ENCOUNTER — Other Ambulatory Visit: Payer: Self-pay

## 2021-05-26 ENCOUNTER — Encounter (HOSPITAL_COMMUNITY): Payer: Self-pay | Admitting: Physician Assistant

## 2021-05-26 ENCOUNTER — Telehealth (INDEPENDENT_AMBULATORY_CARE_PROVIDER_SITE_OTHER): Payer: 59 | Admitting: Physician Assistant

## 2021-05-26 DIAGNOSIS — F411 Generalized anxiety disorder: Secondary | ICD-10-CM | POA: Diagnosis not present

## 2021-05-26 DIAGNOSIS — F5105 Insomnia due to other mental disorder: Secondary | ICD-10-CM | POA: Diagnosis not present

## 2021-05-26 DIAGNOSIS — F99 Mental disorder, not otherwise specified: Secondary | ICD-10-CM

## 2021-05-26 DIAGNOSIS — F410 Panic disorder [episodic paroxysmal anxiety] without agoraphobia: Secondary | ICD-10-CM

## 2021-05-26 DIAGNOSIS — F3181 Bipolar II disorder: Secondary | ICD-10-CM

## 2021-05-26 MED ORDER — QUETIAPINE FUMARATE 50 MG PO TABS: 50.0000 mg | ORAL_TABLET | Freq: Every day | ORAL | 1 refills | Status: DC

## 2021-05-26 MED ORDER — LAMOTRIGINE 100 MG PO TABS: 100.0000 mg | ORAL_TABLET | Freq: Every day | ORAL | 1 refills | Status: DC

## 2021-05-26 MED ORDER — CLONAZEPAM 0.5 MG PO TABS: 0.5000 mg | ORAL_TABLET | Freq: Every day | ORAL | 1 refills | Status: DC

## 2021-05-26 NOTE — Progress Notes (Signed)
BH MD/PA/NP OP Progress Note  Virtual Visit via Telephone Note  I connected with Omar Murray on 05/26/21 at 10:30 AM EDT by telephone and verified that I am speaking with the correct person using two identifiers.  Location: Patient: Home Provider: Clinic   I discussed the limitations, risks, security and privacy concerns of performing an evaluation and management service by telephone and the availability of in person appointments. I also discussed with the patient that there may be a patient responsible charge related to this service. The patient expressed understanding and agreed to proceed.  Follow Up Instructions:  I discussed the assessment and treatment plan with the patient. The patient was provided an opportunity to ask questions and all were answered. The patient agreed with the plan and demonstrated an understanding of the instructions.   The patient was advised to call back or seek an in-person evaluation if the symptoms worsen or if the condition fails to improve as anticipated.  I provided 20 minutes of non-face-to-face time during this encounter.  Meta Hatchet, PA   05/26/2021 10:11 PM Omar Murray  MRN:  [id number]  Chief Complaint: Follow up and medication management  HPI:     Visit Diagnosis:    ICD-10-CM   1. Generalized anxiety disorder  F41.1 clonazePAM (KLONOPIN) 0.5 MG tablet    lamoTRIgine (LAMICTAL) 100 MG tablet    2. Panic disorder  F41.0 clonazePAM (KLONOPIN) 0.5 MG tablet    3. Bipolar 2 disorder (HCC)  F31.81 lamoTRIgine (LAMICTAL) 100 MG tablet    QUEtiapine (SEROQUEL) 50 MG tablet    4. Insomnia due to other mental disorder  F51.05 QUEtiapine (SEROQUEL) 50 MG tablet   F99       Past Psychiatric History:  Bipolar disorder Generalized anxiety disorder Insomnia PTSD  Past Medical History:  Past Medical History:  Diagnosis Date   Insomnia     Past Surgical History:  Procedure Laterality Date   WISDOM TOOTH EXTRACTION       Family Psychiatric History:  Mother - Bipolar Disorder  Family History: History reviewed. No pertinent family history.  Social History:  Social History   Socioeconomic History   Marital status: Single    Spouse name: Not on file   Number of children: Not on file   Years of education: Not on file   Highest education level: Not on file  Occupational History   Not on file  Tobacco Use   Smoking status: Never   Smokeless tobacco: Never  Substance and Sexual Activity   Alcohol use: Yes    Comment: social    Drug use: No   Sexual activity: Not Currently  Other Topics Concern   Not on file  Social History Narrative   Not on file   Social Determinants of Health   Financial Resource Strain: Low Risk    Difficulty of Paying Living Expenses: Not very hard  Food Insecurity: No Food Insecurity   Worried About Running Out of Food in the Last Year: Never true   Ran Out of Food in the Last Year: Never true  Transportation Needs: No Transportation Needs   Lack of Transportation (Medical): No   Lack of Transportation (Non-Medical): No  Physical Activity: Sufficiently Active   Days of Exercise per Week: 5 days   Minutes of Exercise per Session: 60 min  Stress: No Stress Concern Present   Feeling of Stress : Only a little  Social Connections: Socially Isolated   Frequency of Communication with Friends and Family:  Twice a week   Frequency of Social Gatherings with Friends and Family: Twice a week   Attends Religious Services: Never   Database administrator or Organizations: No   Attends Banker Meetings: Never   Marital Status: Never married    Allergies:  Allergies  Allergen Reactions   Celexa [Citalopram Hydrobromide] Hives   Hydrocodone Nausea And Vomiting    Metabolic Disorder Labs: No results found for: HGBA1C, MPG No results found for: PROLACTIN No results found for: CHOL, TRIG, HDL, CHOLHDL, VLDL, LDLCALC No results found for: TSH  Therapeutic  Level Labs: No results found for: LITHIUM No results found for: VALPROATE No components found for:  CBMZ  Current Medications: Current Outpatient Medications  Medication Sig Dispense Refill   acetaminophen (TYLENOL) 500 MG tablet Take 1,000 mg by mouth every 6 (six) hours as needed for moderate pain.      clonazePAM (KLONOPIN) 0.5 MG tablet Take 1 tablet (0.5 mg total) by mouth at bedtime. 30 tablet 1   famotidine (PEPCID) 20 MG tablet Take 2 tablets (40 mg total) by mouth 2 (two) times daily for 14 days. 56 tablet 0   lamoTRIgine (LAMICTAL) 100 MG tablet Take 1 tablet (100 mg total) by mouth daily. 30 tablet 1   Melatonin 10 MG TABS Take 10 mg by mouth at bedtime as needed (sleep).     QUEtiapine (SEROQUEL) 50 MG tablet Take 1 tablet (50 mg total) by mouth at bedtime. 30 tablet 1   sucralfate (CARAFATE) 1 g tablet Take 1 tablet (1 g total) by mouth 3 (three) times daily with meals for 14 days. 42 tablet 0   No current facility-administered medications for this visit.     Musculoskeletal: Strength & Muscle Tone: Unable to assess due to telemedicine visit Gait & Station: Unable to assess due to telemedicine visit Patient leans: Unable to assess due to telemedicine visit  Psychiatric Specialty Exam: Review of Systems  Psychiatric/Behavioral:  Negative for decreased concentration, dysphoric mood, hallucinations, self-injury, sleep disturbance and suicidal ideas. The patient is nervous/anxious. The patient is not hyperactive.    There were no vitals taken for this visit.There is no height or weight on file to calculate BMI.  General Appearance: Unable to assess due to telemedicine visit  Eye Contact:  Unable to assess due to telemedicine visit  Speech:  Clear and Coherent  Volume:  Normal  Mood:  Anxious and Euthymic  Affect:  Appropriate and Congruent  Thought Process:  Coherent, Goal Directed, and Descriptions of Associations: Intact  Orientation:  Full (Time, Place, and Person)   Thought Content: WDL   Suicidal Thoughts:  No  Homicidal Thoughts:  No  Memory:  Immediate;   Good Recent;   Good Remote;   Good  Judgement:  Good  Insight:  Good  Psychomotor Activity:  Normal  Concentration:  Concentration: Good and Attention Span: Good  Recall:  Good  Fund of Knowledge: Good  Language: Good  Akathisia:  NA  Handed:  Left  AIMS (if indicated): not done  Assets:  Communication Skills Desire for Improvement Housing Social Support  ADL's:  Intact  Cognition: WNL  Sleep:  Good   Screenings: GAD-7    Flowsheet Row Video Visit from 05/26/2021 [facility] Video Visit from 01/25/2021 in [facility] Video Visit from 09/28/2020 in [facility]  Total GAD-7 Score 5 4 6       PHQ2-9    Flowsheet  Row Video Visit from 05/26/2021 in [facility] Video Visit from 01/25/2021 in [facility] Counselor from 09/26/2020 in [facility]  PHQ-2 Total Score 0 1 2  PHQ-9 Total Score -- -- 5      Flowsheet Row Video Visit from 05/26/2021 in [facility] Video Visit from 01/25/2021 in [facility]  C-SSRS RISK CATEGORY No Risk No Risk        Assessment and Plan:     1. Generalized anxiety disorder  - clonazePAM (KLONOPIN) 0.5 MG tablet; Take 1 tablet (0.5 mg total) by mouth at bedtime.  Dispense: 30 tablet; Refill: 1 - lamoTRIgine (LAMICTAL) 100 MG tablet; Take 1 tablet (100 mg total) by mouth daily.  Dispense: 30 tablet; Refill: 1  2. Panic disorder  - clonazePAM (KLONOPIN) 0.5 MG tablet; Take 1 tablet (0.5 mg total) by mouth at bedtime.  Dispense: 30 tablet; Refill: 1  3. Bipolar 2 disorder (HCC)  - lamoTRIgine (LAMICTAL) 100 MG tablet; Take 1 tablet (100 mg total) by mouth daily.  Dispense: 30 tablet; Refill: 1 - QUEtiapine (SEROQUEL) 50 MG tablet;  Take 1 tablet (50 mg total) by mouth at bedtime.  Dispense: 30 tablet; Refill: 1  4. Insomnia due to other mental disorder  - QUEtiapine (SEROQUEL) 50 MG tablet; Take 1 tablet (50 mg total) by mouth at bedtime.  Dispense: 30 tablet; Refill: 1  Patient to follow up in 2 months Provider spent a total of 20 minutes with the patient/reviewing patient's chart  Meta Hatchet, PA 05/26/2021, 10:11 PM

## 2021-05-31 ENCOUNTER — Other Ambulatory Visit (HOSPITAL_COMMUNITY): Payer: Self-pay | Admitting: Physician Assistant

## 2021-05-31 DIAGNOSIS — F411 Generalized anxiety disorder: Secondary | ICD-10-CM

## 2021-05-31 DIAGNOSIS — F3181 Bipolar II disorder: Secondary | ICD-10-CM

## 2021-06-17 ENCOUNTER — Other Ambulatory Visit (HOSPITAL_COMMUNITY): Payer: Self-pay | Admitting: Physician Assistant

## 2021-06-17 DIAGNOSIS — F411 Generalized anxiety disorder: Secondary | ICD-10-CM

## 2021-06-17 DIAGNOSIS — F3181 Bipolar II disorder: Secondary | ICD-10-CM

## 2021-07-26 ENCOUNTER — Telehealth (INDEPENDENT_AMBULATORY_CARE_PROVIDER_SITE_OTHER): Payer: 59 | Admitting: Physician Assistant

## 2021-07-26 ENCOUNTER — Encounter (HOSPITAL_COMMUNITY): Payer: Self-pay | Admitting: Physician Assistant

## 2021-07-26 DIAGNOSIS — F411 Generalized anxiety disorder: Secondary | ICD-10-CM

## 2021-07-26 DIAGNOSIS — F3181 Bipolar II disorder: Secondary | ICD-10-CM

## 2021-07-26 DIAGNOSIS — F5105 Insomnia due to other mental disorder: Secondary | ICD-10-CM

## 2021-07-26 DIAGNOSIS — F99 Mental disorder, not otherwise specified: Secondary | ICD-10-CM

## 2021-07-26 DIAGNOSIS — F410 Panic disorder [episodic paroxysmal anxiety] without agoraphobia: Secondary | ICD-10-CM

## 2021-07-26 MED ORDER — CLONAZEPAM 0.5 MG PO TABS: 0.5000 mg | ORAL_TABLET | Freq: Every day | ORAL | 2 refills | Status: DC

## 2021-07-26 MED ORDER — QUETIAPINE FUMARATE 50 MG PO TABS: 50.0000 mg | ORAL_TABLET | Freq: Every day | ORAL | 2 refills | Status: DC

## 2021-07-26 MED ORDER — LAMOTRIGINE 100 MG PO TABS: 100.0000 mg | ORAL_TABLET | Freq: Every day | ORAL | 2 refills | Status: DC

## 2021-07-26 NOTE — Progress Notes (Signed)
BH MD/PA/NP OP Progress Note  Virtual Visit via Video Note  I connected with Omar Murray on 07/26/21 at  3:00 PM EDT by a video enabled telemedicine application and verified that I am speaking with the correct person using two identifiers.  Location: Patient: Home Provider: Clinic   I discussed the limitations of evaluation and management by telemedicine and the availability of in person appointments. The patient expressed understanding and agreed to proceed.  Follow Up Instructions:  I discussed the assessment and treatment plan with the patient. The patient was provided an opportunity to ask questions and all were answered. The patient agreed with the plan and demonstrated an understanding of the instructions.   The patient was advised to call back or seek an in-person evaluation if the symptoms worsen or if the condition fails to improve as anticipated.  I provided 12 minutes of non-face-to-face time during this encounter.  Meta Hatchet, PA    07/26/2021 11:18 PM Sanford Lindblad  MRN:  [id number]  Chief Complaint: Follow up and medication management  HPI:   Omar Murray is a 25 year old male with a past psychiatric history significant for generalized anxiety disorder, bipolar disorder, insomnia, and PTSD who presents to [facility] for follow up and medication management. Patient's is currently taking the following medications:  Klonopin (Clonazepam) 0.5 mg at bedtime Lamotrigine 100 mg daily Seroquel 50 mg at bedtime  Patient reports no issues or concerns regarding his current medication regimen.  Patient denies experiencing any adverse side effects from his medications.  Patient denies the need for dosage adjustments at this time and is requesting refills on all his medications following the conclusion of the encounter.  Patient denies experiencing any depressive episodes nor has he has been experiencing anxiety.  The only stressor that  the patient is currently dealing with is the workload he receives from school in the [employer].  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  A GAD-7 screen was performed with the patient scoring a 4.  Patient is alert and oriented x4, calm, cooperative, and fully engaged in conversation during the encounter.  Patient states that his mood is even and he feels pleasant.  Patient denies suicidal or homicidal ideations.  He further denies auditory or visual hallucinations and does not appear to be responding to internal/external stimuli.  Patient endorses good sleep and receives on average 5 to 8 hours of sleep each night.  Patient endorses good appetite and eats on average 1-2 meals per day.  Patient endorses alcohol consumption occasionally.  Patient denies tobacco use and illicit drug use.  Visit Diagnosis:    ICD-10-CM   1. Generalized anxiety disorder  F41.1 clonazePAM (KLONOPIN) 0.5 MG tablet    lamoTRIgine (LAMICTAL) 100 MG tablet    2. Panic disorder  F41.0 clonazePAM (KLONOPIN) 0.5 MG tablet    3. Bipolar 2 disorder (HCC)  F31.81 lamoTRIgine (LAMICTAL) 100 MG tablet    QUEtiapine (SEROQUEL) 50 MG tablet    4. Insomnia due to other mental disorder  F51.05 QUEtiapine (SEROQUEL) 50 MG tablet   F99       Past Psychiatric History:  Bipolar disorder Generalized anxiety disorder Insomnia PTSD  Past Medical History:  Past Medical History:  Diagnosis Date   Insomnia     Past Surgical History:  Procedure Laterality Date   WISDOM TOOTH EXTRACTION      Family Psychiatric History:  Mother - Bipolar  Disorder  Family History: History reviewed. No pertinent family history.  Social History:  Social History   Socioeconomic History   Marital status: Single    Spouse name: Not on file   Number of children: Not on file   Years of education: Not on file   Highest education level: Not on file  Occupational History   Not on file   Tobacco Use   Smoking status: Never   Smokeless tobacco: Never  Substance and Sexual Activity   Alcohol use: Yes    Comment: social    Drug use: No   Sexual activity: Not Currently  Other Topics Concern   Not on file  Social History Narrative   Not on file   Social Determinants of Health   Financial Resource Strain: Low Risk    Difficulty of Paying Living Expenses: Not very hard  Food Insecurity: No Food Insecurity   Worried About Running Out of Food in the Last Year: Never true   Ran Out of Food in the Last Year: Never true  Transportation Needs: No Transportation Needs   Lack of Transportation (Medical): No   Lack of Transportation (Non-Medical): No  Physical Activity: Sufficiently Active   Days of Exercise per Week: 5 days   Minutes of Exercise per Session: 60 min  Stress: No Stress Concern Present   Feeling of Stress : Only a little  Social Connections: Socially Isolated   Frequency of Communication with Friends and Family: Twice a week   Frequency of Social Gatherings with Friends and Family: Twice a week   Attends Religious Services: Never   Database administrator or Organizations: No   Attends Banker Meetings: Never   Marital Status: Never married    Allergies:  Allergies  Allergen Reactions   Celexa [Citalopram Hydrobromide] Hives   Hydrocodone Nausea And Vomiting    Metabolic Disorder Labs: No results found for: HGBA1C, MPG No results found for: PROLACTIN No results found for: CHOL, TRIG, HDL, CHOLHDL, VLDL, LDLCALC No results found for: TSH  Therapeutic Level Labs: No results found for: LITHIUM No results found for: VALPROATE No components found for:  CBMZ  Current Medications: Current Outpatient Medications  Medication Sig Dispense Refill   acetaminophen (TYLENOL) 500 MG tablet Take 1,000 mg by mouth every 6 (six) hours as needed for moderate pain.      clonazePAM (KLONOPIN) 0.5 MG tablet Take 1 tablet (0.5 mg total) by mouth at  bedtime. 30 tablet 2   famotidine (PEPCID) 20 MG tablet Take 2 tablets (40 mg total) by mouth 2 (two) times daily for 14 days. 56 tablet 0   lamoTRIgine (LAMICTAL) 100 MG tablet Take 1 tablet (100 mg total) by mouth daily. 30 tablet 2   Melatonin 10 MG TABS Take 10 mg by mouth at bedtime as needed (sleep).     QUEtiapine (SEROQUEL) 50 MG tablet Take 1 tablet (50 mg total) by mouth at bedtime. 30 tablet 2   sucralfate (CARAFATE) 1 g tablet Take 1 tablet (1 g total) by mouth 3 (three) times daily with meals for 14 days. 42 tablet 0   No current facility-administered medications for this visit.     Musculoskeletal: Strength & Muscle Tone: Unable to assess due to telemedicine visit Gait & Station: Unable to assess due to telemedicine visit Patient leans: Unable to assess due to telemedicine visit  Psychiatric Specialty Exam: Review of Systems  Psychiatric/Behavioral:  Negative for decreased concentration, dysphoric mood, hallucinations, self-injury, sleep disturbance and  suicidal ideas. The patient is not nervous/anxious and is not hyperactive.    There were no vitals taken for this visit.There is no height or weight on file to calculate BMI.  General Appearance: Well Groomed  Eye Contact:  Good  Speech:  Clear and Coherent and Normal Rate  Volume:  Normal  Mood:  Euthymic  Affect:  Appropriate  Thought Process:  Coherent, Goal Directed, and Descriptions of Associations: Intact  Orientation:  Full (Time, Place, and Person)  Thought Content: WDL   Suicidal Thoughts:  No  Homicidal Thoughts:  No  Memory:  Immediate;   Good Recent;   Good Remote;   Good  Judgement:  Good  Insight:  Good  Psychomotor Activity:  Normal  Concentration:  Concentration: Good and Attention Span: Good  Recall:  Good  Fund of Knowledge: Good  Language: Good  Akathisia:  NA  Handed:  Left  AIMS (if indicated): not done  Assets:  Communication Skills Desire for Improvement Housing Social Support   ADL's:  Intact  Cognition: WNL  Sleep:  Good   Screenings: GAD-7    Flowsheet Row Video Visit from 07/26/2021 in [facility] Video Visit from 05/26/2021 in [facility] Video Visit from 01/25/2021 in [facility] Video Visit from 09/28/2020 in [facility]  Total GAD-7 Score 4 5 4 6       PHQ2-9    Flowsheet Row Video Visit from 07/26/2021 in [facility] Video Visit from 05/26/2021 in [facility] Video Visit from 01/25/2021 in [facility] Counselor from 09/26/2020 in [facility]  PHQ-2 Total Score 0 0 1 2  PHQ-9 Total Score -- -- -- 5      Flowsheet Row Video Visit from 07/26/2021 in [facility] Video Visit from 05/26/2021 in [facility] Video Visit from 01/25/2021 in [place]  C-SSRS RISK CATEGORY No Risk No Risk No Risk        Assessment and Plan:   Darrion Wyszynski is a 25 year old male with a past psychiatric history significant for generalized anxiety disorder, bipolar disorder, insomnia, and PTSD who presents to [facility] for follow up and medication management.  Patient reports no issues or concerns regarding his current medication regimen.  Patient denies the need for dosage adjustments at this time and is requesting refills on all his medications following the conclusion of the encounter.  Patient's medications to be e-prescribed to pharmacy of choice.  1. Generalized anxiety disorder  - clonazePAM (KLONOPIN) 0.5 MG tablet; Take 1 tablet (0.5 mg total) by mouth at bedtime.  Dispense: 30 tablet; Refill: 2 - lamoTRIgine (LAMICTAL) 100 MG tablet; Take 1 tablet (100 mg total) by mouth daily.  Dispense: 30 tablet; Refill: 2  2. Panic  disorder  - clonazePAM (KLONOPIN) 0.5 MG tablet; Take 1 tablet (0.5 mg total) by mouth at bedtime.  Dispense: 30 tablet; Refill: 2  3. Bipolar 2 disorder (HCC)  - lamoTRIgine (LAMICTAL) 100 MG tablet; Take 1 tablet (100 mg total) by mouth daily.  Dispense: 30 tablet; Refill: 2 - QUEtiapine (SEROQUEL) 50 MG tablet; Take 1 tablet (50 mg total) by mouth at bedtime.  Dispense: 30 tablet; Refill: 2  4. Insomnia due to other mental disorder  - QUEtiapine (SEROQUEL) 50 MG tablet; Take 1 tablet (50 mg total) by mouth at  bedtime.  Dispense: 30 tablet; Refill: 2  Patient to follow up in 3 months Provider spent a total of 12 minutes with the patient/reviewing patient's chart  Meta Hatchet, PA 07/26/2021, 11:18 PM

## 2021-10-05 ENCOUNTER — Other Ambulatory Visit (HOSPITAL_COMMUNITY): Payer: Self-pay | Admitting: Physician Assistant

## 2021-10-05 DIAGNOSIS — F3181 Bipolar II disorder: Secondary | ICD-10-CM

## 2021-10-05 DIAGNOSIS — F5105 Insomnia due to other mental disorder: Secondary | ICD-10-CM

## 2021-10-05 DIAGNOSIS — F99 Mental disorder, not otherwise specified: Secondary | ICD-10-CM

## 2021-10-25 ENCOUNTER — Telehealth (INDEPENDENT_AMBULATORY_CARE_PROVIDER_SITE_OTHER): Payer: No Payment, Other | Admitting: Physician Assistant

## 2021-10-25 DIAGNOSIS — F3181 Bipolar II disorder: Secondary | ICD-10-CM | POA: Diagnosis not present

## 2021-10-25 DIAGNOSIS — F411 Generalized anxiety disorder: Secondary | ICD-10-CM | POA: Diagnosis not present

## 2021-10-25 DIAGNOSIS — F5105 Insomnia due to other mental disorder: Secondary | ICD-10-CM

## 2021-10-25 DIAGNOSIS — F99 Mental disorder, not otherwise specified: Secondary | ICD-10-CM

## 2021-10-25 DIAGNOSIS — F410 Panic disorder [episodic paroxysmal anxiety] without agoraphobia: Secondary | ICD-10-CM

## 2021-10-25 MED ORDER — CLONAZEPAM 0.5 MG PO TABS: 0.5000 mg | ORAL_TABLET | Freq: Every day | ORAL | 2 refills | Status: DC

## 2021-10-25 MED ORDER — LAMOTRIGINE 100 MG PO TABS: 100.0000 mg | ORAL_TABLET | Freq: Every day | ORAL | 2 refills | Status: DC

## 2021-10-25 MED ORDER — QUETIAPINE FUMARATE 50 MG PO TABS: ORAL_TABLET | ORAL | 1 refills | Status: DC

## 2021-10-25 NOTE — Progress Notes (Signed)
BH MD/PA/NP OP Progress Note  Virtual Visit via Telephone Note  I connected with Omar Murray on 10/25/21 at  3:00 PM EST by telephone and verified that I am speaking with the correct person using two identifiers.  Location: Patient: Private area Provider: Clinic   I discussed the limitations, risks, security and privacy concerns of performing an evaluation and management service by telephone and the availability of in person appointments. I also discussed with the patient that there may be a patient responsible charge related to this service. The patient expressed understanding and agreed to proceed.  Follow Up Instructions:  I discussed the assessment and treatment plan with the patient. The patient was provided an opportunity to ask questions and all were answered. The patient agreed with the plan and demonstrated an understanding of the instructions.   The patient was advised to call back or seek an in-person evaluation if the symptoms worsen or if the condition fails to improve as anticipated.  I provided 9 minutes of non-face-to-face time during this encounter.  Omar Hatchet, PA    10/25/2021 3:09 PM Omar Murray  MRN:  [id number]  Chief Complaint: Follow up and medication management  HPI:   Omar Murray is a 26 year old male with a past psychiatric history significant for bipolar II disorder, generalized anxiety disorder, panic disorder insomnia who presents to [facility] via virtual telephone visit for follow-up and medication management.  Patient is currently being managed on the following medications:  Clonazepam 0.5 mg at bedtime Lamictal 100 mg daily Seroquel 50 mg at bedtime  Patient reports no issues or concerns regarding his current medication regimen.  Patient denies the need for dosage adjustments at this time and is requesting refills on all his medications following the conclusion of the encounter.  Patient denies  depressive symptoms and states that he is able to have a level mood while taking his medications.  Patient denies any major anxiety but states that he does experience occasional anxiety attributed to school and work.  He states that he is much busier this semester of school than last and does not have that much time to himself.  Patient denies any other stressors at this time.  A GAD-7 screen was performed with the patient scoring a 4.  Patient is alert and oriented x4, calm, cooperative, and fully engaged in conversation during the encounter.  Patient endorses pleasant mood but does endorse some stress.  Patient denies suicidal or homicidal ideations.  He further denies auditory or visual hallucinations and does not appear to be responding to internal/external stimuli.  Patient endorses fluctuating sleep stating that he receives 5 to 6 hours of sleep during the weekdays and 8 to 10 hours of sleep on the weekends.  Patient endorses good appetite and eats on average 2 meals per day.  Patient endorses alcohol consumption occasionally.  Patient denies tobacco use and illicit drug use.  Visit Diagnosis:    ICD-10-CM   1. Generalized anxiety disorder  F41.1 clonazePAM (KLONOPIN) 0.5 MG tablet    lamoTRIgine (LAMICTAL) 100 MG tablet    2. Panic disorder  F41.0 clonazePAM (KLONOPIN) 0.5 MG tablet    3. Bipolar 2 disorder (HCC)  F31.81 lamoTRIgine (LAMICTAL) 100 MG tablet    QUEtiapine (SEROQUEL) 50 MG tablet    4. Insomnia due to other mental disorder  F51.05 QUEtiapine (SEROQUEL) 50 MG tablet   F99       Past Psychiatric History:  Bipolar disorder Generalized anxiety disorder  Insomnia PTSD  Past Medical History:  Past Medical History:  Diagnosis Date   Insomnia     Past Surgical History:  Procedure Laterality Date   WISDOM TOOTH EXTRACTION      Family Psychiatric History:  Mother - Bipolar Disorder  Family History: No family history on file.  Social History:  Social History    Socioeconomic History   Marital status: Single    Spouse name: Not on file   Number of children: Not on file   Years of education: Not on file   Highest education level: Not on file  Occupational History   Not on file  Tobacco Use   Smoking status: Never   Smokeless tobacco: Never  Substance and Sexual Activity   Alcohol use: Yes    Comment: social    Drug use: No   Sexual activity: Not Currently  Other Topics Concern   Not on file  Social History Narrative   Not on file   Social Determinants of Health   Financial Resource Strain: Not on file  Food Insecurity: Not on file  Transportation Needs: Not on file  Physical Activity: Not on file  Stress: Not on file  Social Connections: Not on file    Allergies:  Allergies  Allergen Reactions   Celexa [Citalopram Hydrobromide] Hives   Hydrocodone Nausea And Vomiting    Metabolic Disorder Labs: No results found for: HGBA1C, MPG No results found for: PROLACTIN No results found for: CHOL, TRIG, HDL, CHOLHDL, VLDL, LDLCALC No results found for: TSH  Therapeutic Level Labs: No results found for: LITHIUM No results found for: VALPROATE No components found for:  CBMZ  Current Medications: Current Outpatient Medications  Medication Sig Dispense Refill   acetaminophen (TYLENOL) 500 MG tablet Take 1,000 mg by mouth every 6 (six) hours as needed for moderate pain.      clonazePAM (KLONOPIN) 0.5 MG tablet Take 1 tablet (0.5 mg total) by mouth at bedtime. 30 tablet 2   famotidine (PEPCID) 20 MG tablet Take 2 tablets (40 mg total) by mouth 2 (two) times daily for 14 days. 56 tablet 0   lamoTRIgine (LAMICTAL) 100 MG tablet Take 1 tablet (100 mg total) by mouth daily. 30 tablet 2   Melatonin 10 MG TABS Take 10 mg by mouth at bedtime as needed (sleep).     QUEtiapine (SEROQUEL) 50 MG tablet TAKE 1 TABLET BY MOUTH EVERYDAY AT BEDTIME 90 tablet 1   sucralfate (CARAFATE) 1 g tablet Take 1 tablet (1 g total) by mouth 3 (three) times  daily with meals for 14 days. 42 tablet 0   No current facility-administered medications for this visit.     Musculoskeletal: Strength & Muscle Tone: Unable to assess due to telemedicine visit Gait & Station: Unable to assess due to telemedicine visit Patient leans: Unable to assess due to telemedicine visit  Psychiatric Specialty Exam: Review of Systems  Psychiatric/Behavioral:  Positive for sleep disturbance. Negative for decreased concentration, dysphoric mood, hallucinations, self-injury and suicidal ideas. The patient is not nervous/anxious and is not hyperactive.    There were no vitals taken for this visit.There is no height or weight on file to calculate BMI.  General Appearance: Unable to assess due to telemedicine visit  Eye Contact:  Unable to assess due to telemedicine visit  Speech:  Clear and Coherent and Normal Rate  Volume:  Normal  Mood:  Euthymic  Affect:  Appropriate  Thought Process:  Coherent and Descriptions of Associations: Intact  Orientation:  Full (Time, Place, and Person)  Thought Content: WDL   Suicidal Thoughts:  No  Homicidal Thoughts:  No  Memory:  Immediate;   Good Recent;   Good Remote;   Good  Judgement:  Good  Insight:  Good  Psychomotor Activity:  Normal  Concentration:  Concentration: Good and Attention Span: Good  Recall:  Good  Fund of Knowledge: Good  Language: Good  Akathisia:  No  Handed:  Left  AIMS (if indicated): not done  Assets:  Communication Skills Desire for Improvement Housing Social Support  ADL's:  Intact  Cognition: WNL  Sleep:  Good   Screenings: GAD-7    Flowsheet Row [facility]   Total GAD-7 Score 4 4 5 4 6       PHQ2-9    Flowsheet Row [facility]  PHQ-2 Total Score 0 0 0 1 2  PHQ-9 Total Score -- -- -- -- 5      Flowsheet Row [facility]  C-SSRS RISK CATEGORY No Risk No Risk No Risk        Assessment and Plan:   Omar BoydenJacob Murray is a 26 year old male with a past psychiatric history significant for bipolar II disorder, generalized anxiety disorder, panic disorder insomnia who [facility] via virtual telephone visit for follow-up and medication management.  Patient reports no issues or concerns regarding her current medication regimen.  Patient denies the need for dosage adjustments at this time and is requesting refills on all of his medications following the conclusion of the encounter.  Patient reports that he is stable on his medications and denies instances of depression or major anxiety.  Patient's medications to be e-prescribed to pharmacy of choice.  1. Generalized anxiety disorder  - clonazePAM (KLONOPIN) 0.5 MG tablet; Take 1 tablet (0.5 mg total) by mouth at bedtime.  Dispense: 30 tablet; Refill: 2 - lamoTRIgine (LAMICTAL) 100 MG tablet; Take 1 tablet (100 mg total) by mouth daily.  Dispense: 30 tablet; Refill: 2  2. Panic disorder  - clonazePAM (KLONOPIN) 0.5 MG tablet; Take 1 tablet (0.5 mg total) by mouth at bedtime.  Dispense: 30 tablet; Refill: 2  3. Bipolar 2 disorder  (HCC)  - lamoTRIgine (LAMICTAL) 100 MG tablet; Take 1 tablet (100 mg total) by mouth daily.  Dispense: 30 tablet; Refill: 2 - QUEtiapine (SEROQUEL) 50 MG tablet; TAKE 1 TABLET BY MOUTH EVERYDAY AT BEDTIME  Dispense: 90 tablet; Refill: 1  4. Insomnia due to other mental disorder  Patient to follow up in 3 months Provider spent a total of 9 minutes with the patient/reviewing the patient's chart  Omar HatchetUchenna E Islah Eve, PA 10/25/2021, 3:09 PM

## 2021-10-26 ENCOUNTER — Encounter (HOSPITAL_COMMUNITY): Payer: Self-pay | Admitting: Physician Assistant

## 2021-12-22 ENCOUNTER — Telehealth (HOSPITAL_COMMUNITY): Payer: No Payment, Other | Admitting: Physician Assistant

## 2021-12-29 ENCOUNTER — Telehealth (INDEPENDENT_AMBULATORY_CARE_PROVIDER_SITE_OTHER): Payer: No Payment, Other | Admitting: Physician Assistant

## 2021-12-29 DIAGNOSIS — F411 Generalized anxiety disorder: Secondary | ICD-10-CM

## 2021-12-29 DIAGNOSIS — F3181 Bipolar II disorder: Secondary | ICD-10-CM

## 2021-12-29 DIAGNOSIS — F410 Panic disorder [episodic paroxysmal anxiety] without agoraphobia: Secondary | ICD-10-CM

## 2021-12-29 NOTE — Progress Notes (Signed)
BH MD/PA/NP OP Progress Note ? ?Virtual Visit via Video Note ? ?I connected with Omar Murray on 01/01/22 at  3:30 PM EDT by a video enabled telemedicine application and verified that I am speaking with the correct person using two identifiers. ? ?Location: ?Patient: Home ?Provider: Clinic ?  ?I discussed the limitations of evaluation and management by telemedicine and the availability of in person appointments. The patient expressed understanding and agreed to proceed. ? ?Follow Up Instructions: ? ?I discussed the assessment and treatment plan with the patient. The patient was provided an opportunity to ask questions and all were answered. The patient agreed with the plan and demonstrated an understanding of the instructions. ?  ?The patient was advised to call back or seek an in-person evaluation if the symptoms worsen or if the condition fails to improve as anticipated. ? ?I provided 7 minutes of non-face-to-face time during this encounter. ? ?Meta HatchetUchenna E Nianna Igo, PA ? ? ?12/29/2021 3:35 PM ?Omar BoydenJacob Brem  ?MRN:  [id number] ? ?Chief Complaint:  ?Chief Complaint  ?Patient presents with  ? Follow-up  ? Medication Refill  ? ?HPI:  ? ?Omar Murray is a 26 year old male with a past psychiatric history significant for generalized anxiety disorder, bipolar 2 disorder, and panic disorder who [facility] virtual video visit for follow-up and medication management.  Patient is currently being managed on the following medications: ? ?Clonazepam 0.5 mg at bedtime ?Lamictal 100 mg daily ?Seroquel 50 mg at bedtime ? ?Patient reports no issues or concerns regarding his current medication regimen.  He reports that his medications have been working well and that he feels stable and is able to sleep well.  Patient denies depressive symptoms.  He endorses anxiety and rates his anxiety at 5 out of 10.  Patient states that his anxiety is situational in nature and mostly related to work  and school.  Patient denies any new stressors at this time.  A GAD-7 screen was performed with the patient scoring a 4. ? ?Patient is alert and oriented x4, calm, cooperative, and fully engaged in conversation during the encounter.  Patient endorses even and pleasant mood.  He denies being to of or too down in regards to mood.  Patient denies suicidal or homicidal ideations.  He further denies auditory or visual hallucinations and does not appear to be responding to internal/external stimuli.  Patient endorses fair sleep and receives on average 5 to 6 hours of sleep each night.  Patient endorses good appetite and eats on average 2 meals per day.  Patient endorses alcohol consumption sparingly.  Patient denies tobacco use or illicit drug use. ? ?Visit Diagnosis:  ?  ICD-10-CM   ?1. Generalized anxiety disorder  F41.1 lamoTRIgine (LAMICTAL) 100 MG tablet  ?  clonazePAM (KLONOPIN) 0.5 MG tablet  ?  ?2. Bipolar 2 disorder (HCC)  F31.81 lamoTRIgine (LAMICTAL) 100 MG tablet  ?  QUEtiapine (SEROQUEL) 50 MG tablet  ?  ?3. Panic disorder  F41.0 clonazePAM (KLONOPIN) 0.5 MG tablet  ?  ? ? ?Past Psychiatric History:  ?Bipolar disorder ?Generalized anxiety disorder ?Insomnia ?PTSD ? ?Past Medical History:  ?Past Medical History:  ?Diagnosis Date  ? Insomnia   ?  ?Past Surgical History:  ?Procedure Laterality Date  ? WISDOM TOOTH EXTRACTION    ? ? ?Family Psychiatric History:  ?Mother - Bipolar Disorder ? ?Family History: No family history on file. ? ?Social History:  ?Social History  ? ?Socioeconomic History  ? Marital status:  Single  ?  Spouse name: Not on file  ? Number of children: Not on file  ? Years of education: Not on file  ? Highest education level: Not on file  ?Occupational History  ? Not on file  ?Tobacco Use  ? Smoking status: Never  ? Smokeless tobacco: Never  ?Substance and Sexual Activity  ? Alcohol use: Yes  ?  Comment: social   ? Drug use: No  ? Sexual activity: Not Currently  ?Other Topics Concern  ? Not on  file  ?Social History Narrative  ? Not on file  ? ?Social Determinants of Health  ? ?Financial Resource Strain: Not on file  ?Food Insecurity: Not on file  ?Transportation Needs: Not on file  ?Physical Activity: Not on file  ?Stress: Not on file  ?Social Connections: Not on file  ? ? ?Allergies:  ?Allergies  ?Allergen Reactions  ? Celexa [Citalopram Hydrobromide] Hives  ? Hydrocodone Nausea And Vomiting  ? ? ?Metabolic Disorder Labs: ?No results found for: HGBA1C, MPG ?No results found for: PROLACTIN ?No results found for: CHOL, TRIG, HDL, CHOLHDL, VLDL, LDLCALC ?No results found for: TSH ? ?Therapeutic Level Labs: ?No results found for: LITHIUM ?No results found for: VALPROATE ?No components found for:  CBMZ ? ?Current Medications: ?Current Outpatient Medications  ?Medication Sig Dispense Refill  ? acetaminophen (TYLENOL) 500 MG tablet Take 1,000 mg by mouth every 6 (six) hours as needed for moderate pain.     ? clonazePAM (KLONOPIN) 0.5 MG tablet Take 1 tablet (0.5 mg total) by mouth at bedtime. 30 tablet 1  ? famotidine (PEPCID) 20 MG tablet Take 2 tablets (40 mg total) by mouth 2 (two) times daily for 14 days. 56 tablet 0  ? lamoTRIgine (LAMICTAL) 100 MG tablet Take 1 tablet (100 mg total) by mouth daily. 30 tablet 2  ? Melatonin 10 MG TABS Take 10 mg by mouth at bedtime as needed (sleep).    ? QUEtiapine (SEROQUEL) 50 MG tablet TAKE 1 TABLET BY MOUTH EVERYDAY AT BEDTIME 90 tablet 1  ? sucralfate (CARAFATE) 1 g tablet Take 1 tablet (1 g total) by mouth 3 (three) times daily with meals for 14 days. 42 tablet 0  ? ?No current facility-administered medications for this visit.  ? ? ? ?Musculoskeletal: ?Strength & Muscle Tone: Unable to assess due to telemedicine visit ?Gait & Station: Unable to assess due to telemedicine visit ?Patient leans: Unable to assess due to telemedicine visit ? ?Psychiatric Specialty Exam: ?Review of Systems  ?Psychiatric/Behavioral:  Positive for sleep disturbance. Negative for decreased  concentration, dysphoric mood, hallucinations, self-injury and suicidal ideas. The patient is not nervous/anxious and is not hyperactive.    ?There were no vitals taken for this visit.There is no height or weight on file to calculate BMI.  ?General Appearance: Well Groomed  ?Eye Contact:  Good  ?Speech:  Clear and Coherent and Normal Rate  ?Volume:  Normal  ?Mood:  Euthymic  ?Affect:  Appropriate  ?Thought Process:  Coherent and Descriptions of Associations: Intact  ?Orientation:  Full (Time, Place, and Person)  ?Thought Content: WDL   ?Suicidal Thoughts:  No  ?Homicidal Thoughts:  No  ?Memory:  Immediate;   Good ?Recent;   Good ?Remote;   Good  ?Judgement:  Good  ?Insight:  Good  ?Psychomotor Activity:  Normal  ?Concentration:  Concentration: Good and Attention Span: Good  ?Recall:  Good  ?Fund of Knowledge: Good  ?Language: Good  ?Akathisia:  No  ?Handed:  Left  ?  AIMS (if indicated): not done  ?Assets:  Communication Skills ?Desire for Improvement ?Housing ?Social Support  ?ADL's:  Intact  ?Cognition: WNL  ?Sleep:  Fair  ? ?Screenings: ?GAD-7   ? ?Flowsheet Row Video Visit from 12/29/2021 in [facility] Video Visit from 10/25/2021 in [facility] Video Visit from 07/26/2021 in [facility] Video Visit from 05/26/2021 in [facility] Video Visit from 01/25/2021 in [facility]  ?Total GAD-7 Score 4 4 4 5 4   ? ?  ? ?PHQ2-9   ? ?Flowsheet Row Video Visit from 12/29/2021 in [facility] Video Visit from 10/25/2021 in [facility] Video Visit from 07/26/2021 in [facility] Video Visit from 05/26/2021 in [facility] Video Visit from 01/25/2021 in [facility]  ?PHQ-2 Total Score 0 0 0 0 1  ? ?  ? ?Flowsheet Row Video Visit from 12/29/2021 in [facility] Video Visit from 10/25/2021 in [facility] Video Visit from 07/26/2021 in [facility]  ?C-SSRS RISK CATEGORY No Risk No Risk N

## 2022-01-01 ENCOUNTER — Encounter (HOSPITAL_COMMUNITY): Payer: Self-pay | Admitting: Physician Assistant

## 2022-01-01 MED ORDER — QUETIAPINE FUMARATE 50 MG PO TABS: ORAL_TABLET | ORAL | 1 refills | Status: DC

## 2022-01-01 MED ORDER — CLONAZEPAM 0.5 MG PO TABS: 0.5000 mg | ORAL_TABLET | Freq: Every day | ORAL | 1 refills | Status: DC

## 2022-01-01 MED ORDER — LAMOTRIGINE 100 MG PO TABS: 100.0000 mg | ORAL_TABLET | Freq: Every day | ORAL | 2 refills | Status: DC

## 2022-03-23 ENCOUNTER — Encounter (HOSPITAL_COMMUNITY): Payer: Self-pay

## 2022-03-30 ENCOUNTER — Telehealth (INDEPENDENT_AMBULATORY_CARE_PROVIDER_SITE_OTHER): Payer: No Payment, Other | Admitting: Student in an Organized Health Care Education/Training Program

## 2022-03-30 ENCOUNTER — Other Ambulatory Visit (HOSPITAL_COMMUNITY): Payer: Self-pay | Admitting: Psychiatry

## 2022-03-30 ENCOUNTER — Telehealth (HOSPITAL_COMMUNITY): Payer: No Payment, Other | Admitting: Physician Assistant

## 2022-03-30 DIAGNOSIS — F410 Panic disorder [episodic paroxysmal anxiety] without agoraphobia: Secondary | ICD-10-CM

## 2022-03-30 DIAGNOSIS — F411 Generalized anxiety disorder: Secondary | ICD-10-CM

## 2022-03-30 DIAGNOSIS — F3181 Bipolar II disorder: Secondary | ICD-10-CM | POA: Diagnosis not present

## 2022-03-30 MED ORDER — CLONAZEPAM 0.5 MG PO TABS: 0.5000 mg | ORAL_TABLET | Freq: Every day | ORAL | 1 refills | Status: DC

## 2022-03-30 MED ORDER — LAMOTRIGINE 100 MG PO TABS: 100.0000 mg | ORAL_TABLET | Freq: Every day | ORAL | 2 refills | Status: DC

## 2022-03-30 MED ORDER — QUETIAPINE FUMARATE 50 MG PO TABS: ORAL_TABLET | ORAL | 1 refills | Status: DC

## 2022-03-30 NOTE — Telephone Encounter (Signed)
Refill for klonopin done, 0.5 mg QHS, 30 tabs, one refill

## 2022-03-30 NOTE — Progress Notes (Signed)
Patient ID: Omar Murray, male   DOB: 12-24-1995, 26 y.o.   MRN: [id number]  Virtual Visit via Video Note  I connected with Omar Murray on 03/30/22 at 11:30 AM EDT by a video enabled telemedicine application and verified that I am speaking with the correct person using two identifiers.  Location: Patient: Home Provider: [facility]   I discussed the limitations of evaluation and management by telemedicine and the availability of in person appointments. The patient expressed understanding and agreed to proceed.  History of Present Illness: Omar Murray is a 26 year old male with a past psychiatric history significant for generalized anxiety disorder, bipolar 2 disorder, and panic disorder who presents to [facility] via virtual video visit for follow-up and medication management.  Patient is currently being managed on the following medications:  Clonazepam 0.5 mg at bedtime Lamictal 100 mg daily Seroquel 50 mg at bedtime   He reports that he has been doing well.  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  He reports that his mood level has been stable with no swings either up nor down.  He reports that the Seroquel continues to be a significant help with his sleep.  He does report for the past 2 days feeling a little nauseous and tired but thinks that this is due to minor illness and does not think this is related to his medications at all.  He reports no SI, HI, or AVH.  He reports his sleep is doing good.  He reports his appetite is doing good.  Discussed with him that if he finds himself in a crisis he can go to the nearest emergency department, call 911 or [phone].  He reported understanding and had no other concerns at present.  Discussed mood schedule follow-up appointment for 3 months from now.    Observations/Objective: Psychiatric Specialty  Exam:   Review of Systems  Constitutional:  Positive for fatigue (mild).  Respiratory:  Negative for shortness of breath.   Cardiovascular:  Negative for chest pain.  Gastrointestinal:  Positive for nausea (mild). Negative for abdominal pain, constipation, diarrhea and vomiting.  Neurological:  Negative for dizziness, weakness and headaches.  Psychiatric/Behavioral:  Negative for dysphoric mood, hallucinations, self-injury, sleep disturbance and suicidal ideas. The patient is not nervous/anxious.     There were no vitals taken for this visit.There is no height or weight on file to calculate BMI.  General Appearance: Casual and Fairly Groomed  Eye Contact:  Fair  Speech:  Clear and Coherent and Normal Rate  Volume:  Normal  Mood:  Euthymic  Affect:  Appropriate and Congruent  Thought Process:  Coherent and Goal Directed  Orientation:  Full (Time, Place, and Person)  Thought Content:  Logical  Suicidal Thoughts:  No  Homicidal Thoughts:  No  Memory:  Immediate;   Good Recent;   Good  Judgement:  Good  Insight:  Good  Psychomotor Activity:  Normal  Concentration:  Concentration: Good and Attention Span: Good  Recall:  Good  Fund of Knowledge:  Good  Language:  Good  Akathisia:  Negative  Handed:  Left  AIMS (if indicated):     Assets:  Communication Skills Desire for Improvement Housing Social Support Vocational/Educational  ADL's:  Intact  Cognition:  WNL  Sleep:   good     Assessment and Plan:  Omar Murray is a 26 year old male with a  past psychiatric history significant for generalized anxiety disorder, bipolar 2 disorder, and panic disorder who presents to [facility] via virtual video visit for follow-up and medication management.  Colvin continues to do well with his current medication regimen without any side effects.  We will not make any changes at this time.  Refills of his medications were sent in.  We will schedule a  follow-up appointment for 3 months.    1. Generalized anxiety disorder  -Continue LamoTRIgine (LAMICTAL) 100 MG tablet; Take 1 tablet (100 mg total) by mouth daily.  Dispense: 30 tablet; Refill: 2 -Continue ClonazePAM (KLONOPIN) 0.5 MG tablet; Take 1 tablet (0.5 mg total) by mouth at bedtime.  Dispense: 30 tablet; Refill: 1    2. Bipolar 2 disorder (HCC)  -Continue LamoTRIgine (LAMICTAL) 100 MG tablet; Take 1 tablet (100 mg total) by mouth daily.  Dispense: 30 tablet; Refill: 2 - QUEtiapine (SEROQUEL) 50 MG tablet; TAKE 1 TABLET BY MOUTH EVERYDAY AT BEDTIME  Dispense: 90 tablet; Refill: 1    3. Panic disorder  -Continue ClonazePAM (KLONOPIN) 0.5 MG tablet; Take 1 tablet (0.5 mg total) by mouth at bedtime.  Dispense: 30 tablet; Refill: 1   Follow Up Instructions:    I discussed the assessment and treatment plan with the patient. The patient was provided an opportunity to ask questions and all were answered. The patient agreed with the plan and demonstrated an understanding of the instructions.   The patient was advised to call back or seek an in-person evaluation if the symptoms worsen or if the condition fails to improve as anticipated.  I provided 10 minutes of non-face-to-face time during this encounter.   Omar Franklin, MD

## 2022-06-19 ENCOUNTER — Encounter (HOSPITAL_COMMUNITY): Payer: Self-pay | Admitting: Student in an Organized Health Care Education/Training Program

## 2022-06-19 ENCOUNTER — Telehealth (INDEPENDENT_AMBULATORY_CARE_PROVIDER_SITE_OTHER): Payer: No Payment, Other | Admitting: Student in an Organized Health Care Education/Training Program

## 2022-06-19 DIAGNOSIS — F411 Generalized anxiety disorder: Secondary | ICD-10-CM | POA: Diagnosis not present

## 2022-06-19 DIAGNOSIS — F3181 Bipolar II disorder: Secondary | ICD-10-CM | POA: Diagnosis not present

## 2022-06-19 DIAGNOSIS — F410 Panic disorder [episodic paroxysmal anxiety] without agoraphobia: Secondary | ICD-10-CM | POA: Diagnosis not present

## 2022-06-19 MED ORDER — CLONAZEPAM 0.5 MG PO TABS: 0.5000 mg | ORAL_TABLET | Freq: Every day | ORAL | 1 refills | Status: AC

## 2022-06-19 NOTE — Progress Notes (Signed)
Virtual Visit via Video Note  I connected with Omar Murray on 06/19/22 at  2:30 PM EDT by a video enabled telemedicine application and verified that I am speaking with the correct person using two identifiers.  Location: Patient: Home Provider: [facility]   I discussed the limitations of evaluation and management by telemedicine and the availability of in person appointments. The patient expressed understanding and agreed to proceed.  History of Present Illness:  Omar Murray is a 26 year old male with a past psychiatric history significant for generalized anxiety disorder, bipolar 2 disorder, and panic disorder who presents to [facility] via virtual video visit for follow-up and medication management.  Patient is currently being managed on the following medications:  Clonazepam 0.5 mg at bedtime Lamictal 100 mg daily Seroquel 50 mg at bedtime  He reports that he is continuing to do well on his medication regimen.  He reports he had to schedule an appointment for today because he will be helping his mom moved to [place] and will be out of the country for 3 weeks and only had 1 clonazepam left.  He states this is something his mom had talked about doing for the last 5 years and so he is understanding of this and not distressing to him.  He reports that he will be looking for psychiatrist closer to him in [place] since this is where he now lives.  Discussed with him that if he needs a follow-up appointment he can make one and he reported understanding.  He reports no SI, HI, or AVH.  He reports his sleep is doing good.  He reports his appetite is doing good and he has been improving his diet and intentionally lost 15 pounds since our last appointment.  He reports no other concerns at present.  He will return for follow-up as needed.   Observations/Objective:  Psychiatric Specialty Exam: Physical Exam Constitutional:      General: He is not in acute  distress.    Appearance: Normal appearance. He is normal weight. He is not ill-appearing or toxic-appearing.  HENT:     Head: Normocephalic and atraumatic.  Pulmonary:     Effort: Pulmonary effort is normal.  Neurological:     Mental Status: He is alert.     Review of Systems  Respiratory:  Negative for cough and shortness of breath.   Cardiovascular:  Negative for chest pain.  Gastrointestinal:  Negative for abdominal pain, constipation, diarrhea, nausea and vomiting.  Neurological:  Negative for weakness and headaches.  Psychiatric/Behavioral:  Negative for agitation, dysphoric mood, hallucinations, self-injury, sleep disturbance and suicidal ideas. The patient is not nervous/anxious.     There were no vitals taken for this visit.There is no height or weight on file to calculate BMI.  General Appearance: Casual and Fairly Groomed  Eye Contact:  Good  Speech:  Clear and Coherent and Normal Rate  Volume:  Normal  Mood:   "good"  Affect:  Appropriate and Congruent  Thought Process:  Coherent and Goal Directed  Orientation:  Full (Time, Place, and Person)  Thought Content:  WDL and Logical  Suicidal Thoughts:  No  Homicidal Thoughts:  No  Memory:  Immediate;   Good Recent;   Good  Judgement:  Good  Insight:  Good  Psychomotor Activity:  Normal  Concentration:  Concentration: Good and Attention Span: Good  Recall:  Good  Fund of Knowledge:  Good  Language:  Good  Akathisia:  Negative  Handed:  Left  AIMS (if indicated):     Assets:  Communication Skills Desire for Improvement Housing Physical Health Resilience Vocational/Educational  ADL's:  Intact  Cognition:  WNL  Sleep:   good     Assessment and Plan:  Omar Murray is a 27 year old male with a past psychiatric history significant for generalized anxiety disorder, bipolar 2 disorder, and panic disorder who presents to [facility] via virtual video visit for follow-up and  medication management.   Clete is still doing well without any side effects on his current medication regimen.  As he needs a refill of his Klonopin this will be sent.  Discussed follow-up in 2 to 3 months with him but as he is searching for a psychiatrist in [place] a follow-up will not be scheduled at this time but he will call if an appointment is needed.       1. Generalized anxiety disorder  -Continue LamoTRIgine (LAMICTAL) 100 MG tablet; Take 1 tablet (100 mg total) by mouth daily.  No refill sent at this time. -Continue ClonazePAM (KLONOPIN) 0.5 MG tablet; Take 1 tablet (0.5 mg total) by mouth at bedtime.  Dispense: 30 tablet; Refill: 1     2. Bipolar 2 disorder (HCC)  -Continue LamoTRIgine (LAMICTAL) 100 MG tablet; Take 1 tablet (100 mg total) by mouth daily.  No refill sent at this time. - QUEtiapine (SEROQUEL) 50 MG tablet; TAKE 1 TABLET BY MOUTH EVERYDAY AT BEDTIME  No refill sent at this time.     3. Panic disorder  -Continue ClonazePAM (KLONOPIN) 0.5 MG tablet; Take 1 tablet (0.5 mg total) by mouth at bedtime.  Dispense: 30 tablet; Refill: 1    Follow Up Instructions:    I discussed the assessment and treatment plan with the patient. The patient was provided an opportunity to ask questions and all were answered. The patient agreed with the plan and demonstrated an understanding of the instructions.   The patient was advised to call back or seek an in-person evaluation if the symptoms worsen or if the condition fails to improve as anticipated.  I provided 18 minutes of non-face-to-face time during this encounter.   Briant Cedar, MD

## 2022-07-03 ENCOUNTER — Telehealth (HOSPITAL_COMMUNITY): Payer: No Payment, Other | Admitting: Student in an Organized Health Care Education/Training Program

## 2022-08-03 ENCOUNTER — Telehealth (HOSPITAL_COMMUNITY): Payer: Self-pay | Admitting: *Deleted

## 2022-08-03 DIAGNOSIS — F411 Generalized anxiety disorder: Secondary | ICD-10-CM

## 2022-08-03 DIAGNOSIS — F3181 Bipolar II disorder: Secondary | ICD-10-CM

## 2022-08-03 MED ORDER — LAMOTRIGINE 100 MG PO TABS: 100.0000 mg | ORAL_TABLET | Freq: Every day | ORAL | 2 refills | Status: DC

## 2022-08-03 NOTE — Telephone Encounter (Signed)
Fax from [facility] in [place] for Lamotrigine 100 mg daily. He was seen by Dr Renaldo Fiddler in late Sept. And that note acknowledges he is to continue with it but a new rx was not sent in at that time. Drs note also states he will be finding a provider in the [place] area as its more convenient and he is not longer eligible to come here as he doesn't reside in [place]. I will forward this request to Dr Renaldo Fiddler to consider a bridge of med till he gets newly established.

## 2022-08-03 NOTE — Addendum Note (Signed)
Addended by: Lauro Franklin on: 08/03/2022 08:27 AM   Modules accepted: Orders

## 2022-08-03 NOTE — Telephone Encounter (Signed)
Received message that patient needed refill of his Lamictal.  The patient is in the process of establishing care with a provider in [place] since he lives there now, however, to ensure he does not run out of his medication a refill will be sent.    Sent: -Lamictal 100 mg QHS.  30 tablets with 2 refills.    Arna Snipe MD Resident

## 2022-08-29 ENCOUNTER — Telehealth (HOSPITAL_COMMUNITY): Payer: Self-pay | Admitting: Student in an Organized Health Care Education/Training Program

## 2022-08-29 DIAGNOSIS — F3181 Bipolar II disorder: Secondary | ICD-10-CM

## 2022-08-29 DIAGNOSIS — F411 Generalized anxiety disorder: Secondary | ICD-10-CM

## 2022-08-29 NOTE — Telephone Encounter (Signed)
Patient called in reportinng that he lives in [place] and has been trying to transition but he only had 2 month supply of medication after he was told he has to be a [facility] resident he recainted statment stating he was in school in [place] and he dont really live there. He is requesting medication refill. He

## 2022-08-31 MED ORDER — LAMOTRIGINE 100 MG PO TABS: 100.0000 mg | ORAL_TABLET | Freq: Every day | ORAL | 0 refills | Status: AC

## 2022-08-31 MED ORDER — QUETIAPINE FUMARATE 50 MG PO TABS: ORAL_TABLET | ORAL | 0 refills | Status: AC

## 2022-08-31 NOTE — Telephone Encounter (Signed)
Received message that patient has not been able to become established with a provider in [place] where he is currently going to school.  Will send in 1 month refill of Lamictal and Seroquel to ensure he does not run out, however, for further refills or for refill of his clonazepam he will need to have another appointment scheduled.   Sent: -Lamictal 100 mg daily.  30 tablets with 0 refills. -Seroquel 50 mg QHS.  30 tablets with 0 refills.    Arna Snipe MD Resident
